# Patient Record
Sex: Female | Born: 1990 | Race: White | Hispanic: No | Marital: Married | Smoking: Never smoker
Health system: Southern US, Community
[De-identification: ages and names within clinical notes are randomized; demographics above are authoritative.]

## PROBLEM LIST (undated history)

## (undated) DIAGNOSIS — E0590 Thyrotoxicosis, unspecified without thyrotoxic crisis or storm: Secondary | ICD-10-CM

## (undated) DIAGNOSIS — J45909 Unspecified asthma, uncomplicated: Secondary | ICD-10-CM

## (undated) DIAGNOSIS — Z8489 Family history of other specified conditions: Secondary | ICD-10-CM

## (undated) DIAGNOSIS — R112 Nausea with vomiting, unspecified: Secondary | ICD-10-CM

## (undated) DIAGNOSIS — R51 Headache: Secondary | ICD-10-CM

## (undated) DIAGNOSIS — E039 Hypothyroidism, unspecified: Secondary | ICD-10-CM

## (undated) DIAGNOSIS — E041 Nontoxic single thyroid nodule: Secondary | ICD-10-CM

## (undated) DIAGNOSIS — Z8619 Personal history of other infectious and parasitic diseases: Secondary | ICD-10-CM

## (undated) DIAGNOSIS — Z9889 Other specified postprocedural states: Secondary | ICD-10-CM

## (undated) DIAGNOSIS — R519 Headache, unspecified: Secondary | ICD-10-CM

## (undated) HISTORY — DX: Thyrotoxicosis, unspecified without thyrotoxic crisis or storm: E05.90

## (undated) HISTORY — DX: Unspecified asthma, uncomplicated: J45.909

## (undated) HISTORY — DX: Nausea with vomiting, unspecified: R11.2

## (undated) HISTORY — DX: Personal history of other infectious and parasitic diseases: Z86.19

## (undated) HISTORY — PX: WISDOM TOOTH EXTRACTION: SHX21

## (undated) HISTORY — DX: Hypothyroidism, unspecified: E03.9

## (undated) HISTORY — DX: Other specified postprocedural states: Z98.890

## (undated) HISTORY — DX: Nontoxic single thyroid nodule: E04.1

---

## 2013-02-23 ENCOUNTER — Other Ambulatory Visit: Payer: Self-pay | Admitting: Nurse Practitioner

## 2013-02-24 NOTE — Telephone Encounter (Signed)
AEX was 03/13/12 rx x 1 year was given Current AEX scheduled for 03/20/13  #1 ring with no refills sent to pharmacy to last patient until AEX

## 2013-03-20 ENCOUNTER — Ambulatory Visit: Payer: Self-pay | Admitting: Obstetrics and Gynecology

## 2013-03-23 ENCOUNTER — Other Ambulatory Visit: Payer: Self-pay | Admitting: Obstetrics and Gynecology

## 2013-03-24 NOTE — Telephone Encounter (Signed)
AEX was 03/13/12 rx x 1 year was given  Last refill 02/24/2013 #1/0 refills.   Current AEX scheduled for 04/02/13  #1 ring with no refills sent to pharmacy to last patient until AEX.

## 2013-03-26 ENCOUNTER — Other Ambulatory Visit: Payer: Self-pay | Admitting: Endocrinology

## 2013-03-26 DIAGNOSIS — E041 Nontoxic single thyroid nodule: Secondary | ICD-10-CM

## 2013-03-31 ENCOUNTER — Encounter: Payer: Self-pay | Admitting: Obstetrics & Gynecology

## 2013-04-02 ENCOUNTER — Other Ambulatory Visit (HOSPITAL_COMMUNITY)
Admission: RE | Admit: 2013-04-02 | Discharge: 2013-04-02 | Disposition: A | Discharge status: Home / Self Care | Payer: BC Managed Care – PPO | Source: Ambulatory Visit | Attending: Interventional Radiology | Admitting: Interventional Radiology

## 2013-04-02 ENCOUNTER — Ambulatory Visit
Admission: RE | Admit: 2013-04-02 | Discharge: 2013-04-02 | Disposition: A | Discharge status: Home / Self Care | Payer: BC Managed Care – PPO | Source: Ambulatory Visit | Attending: Endocrinology | Admitting: Endocrinology

## 2013-04-02 ENCOUNTER — Encounter: Payer: Self-pay | Admitting: Obstetrics & Gynecology

## 2013-04-02 ENCOUNTER — Ambulatory Visit (INDEPENDENT_AMBULATORY_CARE_PROVIDER_SITE_OTHER): Payer: BC Managed Care – PPO | Admitting: Obstetrics & Gynecology

## 2013-04-02 VITALS — BP 116/62 | HR 64 | Resp 16 | Ht 66.75 in | Wt 162.6 lb

## 2013-04-02 DIAGNOSIS — Z23 Encounter for immunization: Secondary | ICD-10-CM

## 2013-04-02 DIAGNOSIS — E041 Nontoxic single thyroid nodule: Secondary | ICD-10-CM

## 2013-04-02 DIAGNOSIS — Z01419 Encounter for gynecological examination (general) (routine) without abnormal findings: Secondary | ICD-10-CM

## 2013-04-02 DIAGNOSIS — E049 Nontoxic goiter, unspecified: Secondary | ICD-10-CM | POA: Insufficient documentation

## 2013-04-02 DIAGNOSIS — Z0000 Encounter for general adult medical examination without abnormal findings: Secondary | ICD-10-CM

## 2013-04-02 LAB — HEMOGLOBIN, FINGERSTICK: HEMOGLOBIN, FINGERSTICK: 14.5 g/dL (ref 12.0–16.0)

## 2013-04-02 MED ORDER — NUVARING 0.12-0.015 MG/24HR VA RING: 1.0000 | VAGINAL_RING | VAGINAL | Status: DC

## 2013-04-02 NOTE — Patient Instructions (Signed)
EXERCISE AND DIET:  We recommended that you start or continue a regular exercise program for good health. Regular exercise means any activity that makes your heart beat faster and makes you sweat.  We recommend exercising at least 30 minutes per day at least 3 days a week, preferably 4 or 5.  We also recommend a diet low in fat and sugar.  Inactivity, poor dietary choices and obesity can cause diabetes, heart attack, stroke, and kidney damage, among others.    ALCOHOL AND SMOKING:  Women should limit their alcohol intake to no more than 7 drinks/beers/glasses of wine (combined, not each!) per week. Moderation of alcohol intake to this level decreases your risk of breast cancer and liver damage. And of course, no recreational drugs are part of a healthy lifestyle.  And absolutely no smoking or even second hand smoke. Most people know smoking can cause heart and lung diseases, but did you know it also contributes to weakening of your bones? Aging of your skin?  Yellowing of your teeth and nails?  CALCIUM AND VITAMIN D:  Adequate intake of calcium and Vitamin D are recommended.  The recommendations for exact amounts of these supplements seem to change often, but generally speaking 600 mg of calcium (either carbonate or citrate) and 800 units of Vitamin D per day seems prudent. Certain women may benefit from higher intake of Vitamin D.  If you are among these women, your doctor will have told you during your visit.    PAP SMEARS:  Pap smears, to check for cervical cancer or precancers,  have traditionally been done yearly, although recent scientific advances have shown that most women can have pap smears less often.  However, every woman still should have a physical exam from her gynecologist every year. It will include a breast check, inspection of the vulva and vagina to check for abnormal growths or skin changes, a visual exam of the cervix, and then an exam to evaluate the size and shape of the uterus and  ovaries.  And after 25 years of age, a rectal exam is indicated to check for rectal cancers. We will also provide age appropriate advice regarding health maintenance, like when you should have certain vaccines, screening for sexually transmitted diseases, bone density testing, colonoscopy, mammograms, etc.   MAMMOGRAMS:  All women over 40 years old should have a yearly mammogram. Many facilities now offer a "3D" mammogram, which may cost around $50 extra out of pocket. If possible,  we recommend you accept the option to have the 3D mammogram performed.  It both reduces the number of women who will be called back for extra views which then turn out to be normal, and it is better than the routine mammogram at detecting truly abnormal areas.       

## 2013-04-02 NOTE — Progress Notes (Signed)
23 y.o. G0P0000 MarriedCaucasianF here for annual exam.  Having a thyroid biopsy today.  Had screening at work showing a nodule.  Saw Dr. [place]lyn [place] who ordered the [facility].  Has had thyroid labs done.    Desires continuation of birth control.  Doing well on Nuva ring.  Patient's last menstrual period was 03/26/2013.          Sexually active: yes  The current method of family planning is NuvaRing vaginal inserts.    Exercising: yes  running, eliptical, and weight machines Smoker:  no  Health Maintenance: Pap:  03/13/12 WNL History of abnormal Pap:  no MMG:  none Colonoscopy:  none BMD:   none TDaP:  2003 Screening Labs: n/a, Hb today: 14.5, Urine today: WBC-trace   reports that she has never smoked. She has never used smokeless tobacco. She reports that she does not drink alcohol or use illicit drugs.  Past Medical History  Diagnosis Date  . Asthma   . Thyroid nodule     seeing Dr [place]lyn [place], bx 04/02/13    Past Surgical History  Procedure Laterality Date  . Wisdom tooth extraction      Current Outpatient Prescriptions  Medication Sig Dispense Refill  . NUVARING 0.12-0.015 MG/24HR vaginal ring INSERT 1 RING VAGINALLY FOR 3 WEEKS THEN REMOVE 1 WEEK  1 each  0   No current facility-administered medications for this visit.    Family History  Problem Relation Age of Onset  . Diabetes Maternal Grandmother   . Hypertension Maternal Grandmother   . Thyroid disease Mother   . Thyroid disease Maternal Grandmother   . Thyroid disease Paternal Grandmother     ROS:  Pertinent items are noted in HPI.  Otherwise, a comprehensive ROS was negative.  Exam:   BP 116/62  Pulse 64  Resp 16  Ht 5' 6.75" (1.695 m)  Wt 162 lb 9.6 oz (73.755 kg)  BMI 25.67 kg/m2  LMP 03/26/2013  Weight change: -3lbs  Height: 5' 6.75" (169.5 cm)  Ht Readings from Last 3 Encounters:  04/02/13 5' 6.75" (1.695 m)    General appearance: alert, cooperative and appears stated  age Head: Normocephalic, without obvious abnormality, atraumatic Neck: no adenopathy, supple, symmetrical, trachea midline and thyroid normal to inspection and palpation, I personally cannot palpate a nodule in her thyroid Lungs: clear to auscultation bilaterally Breasts: normal appearance, no masses or tenderness Heart: regular rate and rhythm Abdomen: soft, non-tender; bowel sounds normal; no masses,  no organomegaly Extremities: extremities normal, atraumatic, no cyanosis or edema Skin: Skin color, texture, turgor normal. No rashes or lesions Lymph nodes: Cervical, supraclavicular, and axillary nodes normal. No abnormal inguinal nodes palpated Neurologic: Grossly normal   Pelvic: External genitalia:  no lesions              Urethra:  normal appearing urethra with no masses, tenderness or lesions              Bartholins and Skenes: normal                 Vagina: normal appearing vagina with normal color and discharge, no lesions              Cervix: no lesions              Pap taken: no Bimanual Exam:  Uterus:  normal size, contour, position, consistency, mobility, non-tender              Adnexa:  normal adnexa and no mass, fullness, tenderness               Rectovaginal: Confirms               Anus:  normal sphincter tone, no lesions  A:  Well Woman with normal exam Intentional weight loss Nuva ring for B.C Thyroid nodule, having biopsy today  P:   Mammogram starting age 44 pap smear 1/14--normal.  No pap smear indicated today. Tdap today return annually or prn  An After Visit Summary was printed and given to the patient.

## 2013-09-07 ENCOUNTER — Other Ambulatory Visit: Payer: Self-pay | Admitting: Obstetrics & Gynecology

## 2013-09-15 ENCOUNTER — Other Ambulatory Visit: Payer: Self-pay | Admitting: Obstetrics & Gynecology

## 2013-09-16 ENCOUNTER — Telehealth: Payer: Self-pay | Admitting: Obstetrics & Gynecology

## 2013-09-16 MED ORDER — NUVARING 0.12-0.015 MG/24HR VA RING: 1.0000 | VAGINAL_RING | VAGINAL | Status: DC

## 2013-09-16 NOTE — Telephone Encounter (Signed)
Patient received 1 year rx at last visit with Dr. Hyacinth MeekerMiller [facility].  Reordered new rx until next annual.  Detailed message left to advise [facility], if any further concerns to please call back. Detailed message okay per designated party release form.  Routing to provider for final review. Patient agreeable to disposition. Will close encounter

## 2013-09-16 NOTE — Telephone Encounter (Signed)
Patient calling re: pharmacy did not have refills on file for her Nuvaring. Patient reports she is "doing well" on the medication and requests more refills to last until next AEX 04/15/14.  [facility]  [address]  [place]

## 2013-11-10 ENCOUNTER — Telehealth: Payer: Self-pay | Admitting: Obstetrics & Gynecology

## 2013-11-10 NOTE — Telephone Encounter (Signed)
Patient calling to speak with nurse about "light bleeding when" she wiped after going to the bathroom. She has an appointment 11/13/13 to verify pregnancy.

## 2013-11-10 NOTE — Telephone Encounter (Signed)
I have reviewed these notes and will see the patient tomorrow in the office as planned.

## 2013-11-10 NOTE — Telephone Encounter (Signed)
Spoke with patient at time of incoming call. She has had 3 positive pregnancy tests at home over the weekend. LMP 10/09/13. Patient states she urinated about 20 minutes prior to calling in and had light spotting of blood on toilet tissue.  Patient denies pain. Last sexual activity 11/07/13. [place], took a long bike ride. Patient states she is normally very physically active.  Advised patient would discuss with provider and return her call.   1500: Returned call to patient after reviewing with nursing supervisor and Dr. Hyacinth Meeker.  Patient given appointment tomorrow with Dr. Edward Jolly at 1400. Patient tearful as she has urinated again and states blood on tissue was "more red" denies increase in bleeding. Again, denies pain. Patient advised to rest tonight, advised pelvic rest, increase fluids. Advised can call office any time to speak with provider on call if symptoms change. Advised patient to call back or seek immediate medical care if bleeding worsens or soaking through 1 pad/tampon per hour for two hours or develops pelvic/abdominal pain or fevers.  Patient verbalized understanding.   Routing to Dr. Hyacinth Meeker  Cc Dr. Edward Jolly for 11/11/13 visit

## 2013-11-11 ENCOUNTER — Ambulatory Visit (INDEPENDENT_AMBULATORY_CARE_PROVIDER_SITE_OTHER): Payer: BC Managed Care – PPO | Admitting: Obstetrics and Gynecology

## 2013-11-11 ENCOUNTER — Encounter: Payer: Self-pay | Admitting: Obstetrics and Gynecology

## 2013-11-11 VITALS — BP 122/80 | HR 64 | Ht 66.75 in | Wt 162.2 lb

## 2013-11-11 DIAGNOSIS — N912 Amenorrhea, unspecified: Secondary | ICD-10-CM

## 2013-11-11 DIAGNOSIS — N939 Abnormal uterine and vaginal bleeding, unspecified: Secondary | ICD-10-CM

## 2013-11-11 DIAGNOSIS — N926 Irregular menstruation, unspecified: Secondary | ICD-10-CM

## 2013-11-11 LAB — POCT URINE PREGNANCY: Preg Test, Ur: NEGATIVE

## 2013-11-11 NOTE — Progress Notes (Signed)
Patient ID: Debra Robinson, female   DOB: 19-Oct-1990, 23 y.o.   MRN: [id number] GYNECOLOGY VISIT  PCP:   Adrian Prince, MD  Referring provider:   HPI: 23 y.o.   Married  Caucasian  female   G1P0000 with Patient's last menstrual period was 10/09/2013.   here for evaluation of vaginal bleeding with 3 positive home pregnancy tests. Bleeding like a menstruation now.  Started bleeding on August 26 - spotting only.  Positive UPT at home on Sept. 5 and Sept. 7. No cramping.   Urine UPT:  Neg  GYNECOLOGIC HISTORY: Patient's last menstrual period was 10/09/2013. Sexually active: yes  Partner preference: female Contraception:   none Menopausal hormone therapy: n/a DES exposure:   no Blood transfusions:   no Sexually transmitted diseases:  no  GYN procedures and prior surgeries:  none Last mammogram:   n/a             Last pap and high risk HPV testing:   03-14-11 wnl   History of abnormal pap smear:  no   OB History   Grav Para Term Preterm Abortions TAB SAB Ect Mult Living         LIFESTYLE: Exercise:   Running/biking/weights           Tobacco:   no Alcohol:no Drug use:  no  There are no active problems to display for this patient.   Past Medical History  Diagnosis Date  . Asthma   . Thyroid nodule     seeing Dr Evlyn Kanner, bx 04/02/13    Past Surgical History  Procedure Laterality Date  . Wisdom tooth extraction      Current Outpatient Prescriptions  Medication Sig Dispense Refill  . Prenatal Vit-Fe Fumarate-FA (MULTIVITAMIN-PRENATAL) 27-0.8 MG TABS tablet Take 1 tablet by mouth daily at 12 noon.       No current facility-administered medications for this visit.     ALLERGIES: Augmentin  Family History  Problem Relation Age of Onset  . Diabetes Maternal Grandmother   . Hypertension Maternal Grandmother   . Thyroid disease Maternal Grandmother   . Thyroid disease Mother   . Thyroid disease Paternal Grandmother     History   Social History  .  Marital Status: Married    Spouse Name: N/A    Number of Children: N/A  . Years of Education: N/A   Occupational History  . Not on file.   Social History Main Topics  . Smoking status: Never Smoker   . Smokeless tobacco: Never Used  . Alcohol Use: No  . Drug Use: No  . Sexual Activity: Yes    Partners: Male   Other Topics Concern  . Not on file   Social History Narrative  . No narrative on file    ROS:  Pertinent items are noted in HPI.  PHYSICAL EXAMINATION:    BP 122/80  Pulse 64  Ht 5' 6.75" (1.695 m)  Wt 162 lb 3.2 oz (73.573 kg)  BMI 25.61 kg/m2  LMP 10/09/2013   Wt Readings from Last 3 Encounters:  11/11/13 162 lb 3.2 oz (73.573 kg)  04/02/13 162 lb 9.6 oz (73.755 kg)     Ht Readings from Last 3 Encounters:  11/11/13 5' 6.75" (1.695 m)  04/02/13 5' 6.75" (1.695 m)    General appearance: alert, cooperative and appears stated age, tearful.   Pelvic: External genitalia:  no lesions  Urethra:  normal appearing urethra with no masses, tenderness or lesions              Bartholins and Skenes: normal                 Vagina: normal appearing vagina with normal color and discharge, no lesions.  Blood noted in the vagina.  No POC noted.               Cervix: normal appearance                 Bimanual Exam:  Uterus:  uterus is normal size, shape, consistency and nontender, retroverted.                                       Adnexa: normal adnexa in size, nontender and no masses                                      ASSESSMENT  Abnormal uterine bleeding.  Positive UPT at home.  Negative UPT here.  Suspect early pregnancy loss.   PLAN  I discussed doing a quantitative beta hCG now and then possibly in 48 hours if the test today is positive.  I told the patient that I think this most likely represents an early pregnancy loss.  We discussed the frequency of pregnancy loss, the etiology being aneuploidy, and that one miscarriage does not increase the  risk of future pregnancy loss.  I told her that it was really positive news that they have been able to conceive.   An After Visit Summary was printed and given to the patient.  20 minutes face to face time of which over 50% was spent in counseling.

## 2013-11-12 ENCOUNTER — Other Ambulatory Visit: Payer: Self-pay | Admitting: Obstetrics and Gynecology

## 2013-11-12 ENCOUNTER — Telehealth: Payer: Self-pay

## 2013-11-12 DIAGNOSIS — O200 Threatened abortion: Secondary | ICD-10-CM

## 2013-11-12 LAB — HCG, QUANTITATIVE, PREGNANCY: hCG, Beta Chain, Quant, S: 5.7 m[IU]/mL

## 2013-11-12 NOTE — Telephone Encounter (Signed)
Spoke with patient. Advised of message as seen below from Dr.Silva. Patient is agreeable and verbalizes understanding. Will return for stat beta HCG tomorrow at 9:30am.  Routing to provider for final review. Patient agreeable to disposition. Will close encounter

## 2013-11-12 NOTE — Telephone Encounter (Signed)
Message copied by Jannet Askew on Thu Nov 12, 2013  5:18 PM ------      Message from: [place]UN[place] DE Gwenevere Ghazi, BROOK E      Created: Thu Nov 12, 2013  8:32 AM       Please inform patient of low positive HCG.        Patient had a positive UPT at home and then bleeding.       Yesterday in office, UPT was negative.       I told patient we would do blood testing for final confirmation of status of pregnancy.       She is to return on Friday am for a STAT beta HCG.            She has received this first test through My Chart. ------

## 2013-11-13 ENCOUNTER — Other Ambulatory Visit: Payer: Self-pay | Admitting: Obstetrics and Gynecology

## 2013-11-13 ENCOUNTER — Telehealth: Payer: Self-pay | Admitting: Obstetrics and Gynecology

## 2013-11-13 ENCOUNTER — Other Ambulatory Visit (INDEPENDENT_AMBULATORY_CARE_PROVIDER_SITE_OTHER): Payer: BC Managed Care – PPO

## 2013-11-13 ENCOUNTER — Ambulatory Visit: Payer: BC Managed Care – PPO | Admitting: Certified Nurse Midwife

## 2013-11-13 DIAGNOSIS — O200 Threatened abortion: Secondary | ICD-10-CM

## 2013-11-13 LAB — HCG, QUANTITATIVE, PREGNANCY: hCG, Beta Chain, Quant, S: 2.9 m[IU]/mL

## 2013-11-13 NOTE — Telephone Encounter (Signed)
Please let the patient know that the HCG has decreased to 2.9. I am so sorry that this represents a miscarriage for her.  A level of under 2 is considered completely negative.  She may have a final quantitative HCG next week.  She is invited to return for any further needs and consultation if she has questions.  I was planning on calling myself, but want her to have the results now.

## 2013-11-13 NOTE — Telephone Encounter (Signed)
Spoke with patient. Advised of message as seen below from Dr.Silva. Patient agreeable and verbalizes understanding. Patient states "I don't think it is necessary for me to come in next week. If it is going down it will keep going down." Advised if she changes her mind and would like to come in to give our office a call and we will set it up for her. Patient agreeable.  Routing to provider for final review. Patient agreeable to disposition. Will close encounter

## 2013-11-13 NOTE — Telephone Encounter (Signed)
Follow up for you.   I saw your patient in your absence on Wednesday and did serial hCG levels on 9/9 and 9/11.  Unfortunately she has had a miscarriage.

## 2013-11-13 NOTE — Telephone Encounter (Signed)
Pt  wants to talk with [facility]. She is checking to see if her results are in yet.

## 2013-11-13 NOTE — Telephone Encounter (Signed)
Spoke with patient. Patient would like to know about results of blood pregnancy test that she had this morning. Advised results are in but need to be reviewed by Dr.Miller. Patient is agreeable. Patient requests call back before the end of the day.

## 2013-11-13 NOTE — Telephone Encounter (Signed)
Routing to Dr.Silva for review. Previously routed to Dr.Miller on accident. Patient was seen with Dr.Silva on 9/9.

## 2013-11-13 NOTE — Telephone Encounter (Signed)
Phone call to patient regarding her declining hCG level and how it represents miscarriage for her.  Level 2.9 needs to be followed down to a negative level, which is under 2.  Patient counseled on miscarriage and waiting at least one normal menses before trying for pregnancy again.  Continue healthy lifestyle and PNV.  Questions invited and answered.  Patient will return to office on 11/23/13 for next quant beta hCG.

## 2013-11-16 ENCOUNTER — Other Ambulatory Visit (INDEPENDENT_AMBULATORY_CARE_PROVIDER_SITE_OTHER): Payer: BC Managed Care – PPO

## 2013-11-16 DIAGNOSIS — O200 Threatened abortion: Secondary | ICD-10-CM

## 2013-11-17 ENCOUNTER — Telehealth: Payer: Self-pay

## 2013-11-17 LAB — HCG, QUANTITATIVE, PREGNANCY: hCG, Beta Chain, Quant, S: <2 m[IU]/mL

## 2013-11-17 NOTE — Telephone Encounter (Signed)
Patient read mychart message this morning 9/15 at 8:24 AM  Entered by Jacqualin Combes de Carvalho E Sil* at 11/17/2013 6:06 AM Read by Carmelia Roller at 11/17/2013 8:24 AM Good morning Debra Robinson,   Your blood test is now completely negative for pregnancy hormone.   Thank you for returning for this test.   Your next menstruation may occur in the next 4 - 6 weeks. It may be different from normal for you, for example more heavy or crampy.   Call if we can do anything for you,   Conley Simmonds, MD   Routing to provider for final review. Patient agreeable to disposition. Will close encounter

## 2013-11-17 NOTE — Telephone Encounter (Signed)
Message copied by Jannet Askew on Tue Nov 17, 2013  3:09 PM ------      Message from: [facility] Gwenevere Ghazi, BROOK E      Created: Tue Nov 17, 2013  6:07 AM       Please contact patient to be sure she received the My Chart note that the hCG is now negative. ------

## 2013-12-17 DIAGNOSIS — Z0289 Encounter for other administrative examinations: Secondary | ICD-10-CM

## 2014-01-04 ENCOUNTER — Encounter: Payer: Self-pay | Admitting: Obstetrics and Gynecology

## 2014-01-15 LAB — OB RESULTS CONSOLE RPR: RPR: NONREACTIVE

## 2014-01-15 LAB — OB RESULTS CONSOLE RUBELLA ANTIBODY, IGM: Rubella: IMMUNE

## 2014-01-15 LAB — OB RESULTS CONSOLE ANTIBODY SCREEN: Antibody Screen: NEGATIVE

## 2014-01-15 LAB — OB RESULTS CONSOLE ABO/RH: RH Type: POSITIVE

## 2014-01-15 LAB — OB RESULTS CONSOLE HEPATITIS B SURFACE ANTIGEN: Hepatitis B Surface Ag: NEGATIVE

## 2014-01-15 LAB — OB RESULTS CONSOLE HIV ANTIBODY (ROUTINE TESTING): HIV: NONREACTIVE

## 2014-01-26 LAB — OB RESULTS CONSOLE GC/CHLAMYDIA
Chlamydia: NEGATIVE
Gonorrhea: NEGATIVE

## 2014-03-05 NOTE — L&D Delivery Note (Signed)
Delivery Note  First Stage: Labor onset: 08/11/2014 @ 0500 Augmentation : AROM Analgesia Eliezer Lofts intrapartum: none AROM at 1018  Second Stage: Complete dilation at 1243 Onset of pushing at 1245 FHR second stage 100, variables x 1 min each  Delivery of a viable female at 2 by CNM in OA position Loose nuchal and body cord - released over fetal head before delivery of shoulders without difficulty Cord double clamped after cessation of pulsation, cut by FOB Cord blood sample collected   Collection of cord blood donation by Velna Hatchet from [facility]   Third Stage: Placenta delivered via Tomasa Blase intact with 3 VC @ 1257 Placenta disposition: L&D Uterine tone firm / bleeding minimal  2nd degree perineal laceration identified  Anesthesia for repair: 1% Lidocaine Repair: Deep interrupted stitches with 2.0, running locked stitch with 3.0, subcuticular stitch with 4.0 vicryl Est. Blood Loss (mL): 311  Complications: none  Mom to postpartum.  Baby to Couplet care / Skin to Skin.  Newborn: Birth Weight: 7 lbs 5.3 oz  Apgar Scores: 9/9 Feeding planned: breast  Raelyn Mora, M  MSN, CNM 08/12/2014, 1:39 PM

## 2014-04-13 ENCOUNTER — Telehealth: Payer: Self-pay | Admitting: Obstetrics & Gynecology

## 2014-04-13 NOTE — Telephone Encounter (Signed)
Left patient a message regarding canceled appointment 04/15/14 (via automated reminder call) to call and rescheduled when she is ready.

## 2014-04-15 ENCOUNTER — Ambulatory Visit: Payer: BC Managed Care – PPO | Admitting: Obstetrics & Gynecology

## 2014-07-19 LAB — OB RESULTS CONSOLE GBS: STREP GROUP B AG: NEGATIVE

## 2014-07-21 ENCOUNTER — Other Ambulatory Visit: Payer: Self-pay | Admitting: Certified Nurse Midwife

## 2014-07-21 DIAGNOSIS — E049 Nontoxic goiter, unspecified: Secondary | ICD-10-CM

## 2014-07-22 ENCOUNTER — Ambulatory Visit
Admission: RE | Admit: 2014-07-22 | Discharge: 2014-07-22 | Disposition: A | Discharge status: Home / Self Care | Payer: BLUE CROSS/BLUE SHIELD | Source: Ambulatory Visit | Attending: Certified Nurse Midwife | Admitting: Certified Nurse Midwife

## 2014-07-22 DIAGNOSIS — E049 Nontoxic goiter, unspecified: Secondary | ICD-10-CM

## 2014-08-10 ENCOUNTER — Telehealth (HOSPITAL_COMMUNITY): Payer: Self-pay | Admitting: *Deleted

## 2014-08-10 ENCOUNTER — Encounter (HOSPITAL_COMMUNITY): Payer: Self-pay | Admitting: *Deleted

## 2014-08-10 NOTE — Telephone Encounter (Signed)
Preadmission screen  

## 2014-08-11 ENCOUNTER — Encounter (HOSPITAL_COMMUNITY): Payer: Self-pay | Admitting: *Deleted

## 2014-08-11 ENCOUNTER — Inpatient Hospital Stay (HOSPITAL_COMMUNITY)
Admission: AD | Admit: 2014-08-11 | Discharge: 2014-08-11 | Disposition: A | Discharge status: Home / Self Care | Payer: BLUE CROSS/BLUE SHIELD | Source: Ambulatory Visit | Attending: Obstetrics | Admitting: Obstetrics

## 2014-08-11 DIAGNOSIS — O99284 Endocrine, nutritional and metabolic diseases complicating childbirth: Secondary | ICD-10-CM | POA: Diagnosis present

## 2014-08-11 DIAGNOSIS — Z3A39 39 weeks gestation of pregnancy: Secondary | ICD-10-CM

## 2014-08-11 DIAGNOSIS — D509 Iron deficiency anemia, unspecified: Secondary | ICD-10-CM | POA: Diagnosis present

## 2014-08-11 DIAGNOSIS — O9902 Anemia complicating childbirth: Secondary | ICD-10-CM | POA: Diagnosis present

## 2014-08-11 DIAGNOSIS — E0590 Thyrotoxicosis, unspecified without thyrotoxic crisis or storm: Secondary | ICD-10-CM | POA: Diagnosis present

## 2014-08-11 DIAGNOSIS — Z833 Family history of diabetes mellitus: Secondary | ICD-10-CM

## 2014-08-11 DIAGNOSIS — Z8249 Family history of ischemic heart disease and other diseases of the circulatory system: Secondary | ICD-10-CM

## 2014-08-11 NOTE — MAU Note (Signed)
Contractions all day, more regular now.

## 2014-08-11 NOTE — Discharge Instructions (Signed)
Do not use Benadryl or Tylenol pm tonight before bed. You may come into the office in am for an assessment. A warm bath can help with back and labor discomfort.Braxton Hicks Contractions Contractions of the uterus can occur throughout pregnancy. Contractions are not always a sign that you are in labor.  WHAT ARE BRAXTON HICKS CONTRACTIONS?  Contractions that occur before labor are called Braxton Hicks contractions, or false labor. Toward the end of pregnancy (32-34 weeks), these contractions can develop more often and may become more forceful. This is not true labor because these contractions do not result in opening (dilatation) and thinning of the cervix. They are sometimes difficult to tell apart from true labor because these contractions can be forceful and people have different pain tolerances. You should not feel embarrassed if you go to the hospital with false labor. Sometimes, the only way to tell if you are in true labor is for your health care provider to look for changes in the cervix. If there are no prenatal problems or other health problems associated with the pregnancy, it is completely safe to be sent home with false labor and await the onset of true labor. HOW CAN YOU TELL THE DIFFERENCE BETWEEN TRUE AND FALSE LABOR? False Labor  The contractions of false labor are usually shorter and not as hard as those of true labor.   The contractions are usually irregular.   The contractions are often felt in the front of the lower abdomen and in the groin.   The contractions may go away when you walk around or change positions while lying down.   The contractions get weaker and are shorter lasting as time goes on.   The contractions do not usually become progressively stronger, regular, and closer together as with true labor.  True Labor  Contractions in true labor last 30-70 seconds, become very regular, usually become more intense, and increase in frequency.   The contractions  do not go away with walking.   The discomfort is usually felt in the top of the uterus and spreads to the lower abdomen and low back.   True labor can be determined by your health care provider with an exam. This will show that the cervix is dilating and getting thinner.  WHAT TO REMEMBER  Keep up with your usual exercises and follow other instructions given by your health care provider.   Take medicines as directed by your health care provider.   Keep your regular prenatal appointments.   Eat and drink lightly if you think you are going into labor.   If Braxton Hicks contractions are making you uncomfortable:   Change your position from lying down or resting to walking, or from walking to resting.   Sit and rest in a tub of warm water.   Drink 2-3 glasses of water. Dehydration may cause these contractions.   Do slow and deep breathing several times an hour.  WHEN SHOULD I SEEK IMMEDIATE MEDICAL CARE? Seek immediate medical care if:  Your contractions become stronger, more regular, and closer together.   You have fluid leaking or gushing from your vagina.   You have a fever.   You pass blood-tinged mucus.   You have vaginal bleeding.   You have continuous abdominal pain.   You have low back pain that you never had before.   You feel your baby's head pushing down and causing pelvic pressure.   Your baby is not moving as much as it used to.  Document Released: 02/19/2005 Document Revised: 02/24/2013 Document Reviewed: 12/01/2012 [facility] Patient Information 2015 [employer] [place]. This information is not intended to replace advice given to you by your health care provider. Make sure you discuss any questions you have with your health care provider.

## 2014-08-11 NOTE — MAU Note (Signed)
Pt may be discharged to home with d/c instructions.

## 2014-08-11 NOTE — MAU Note (Signed)
Shawna OrleansMelanie also gave other orders for pt re: exam in am, warm bath and no Tylenol pm or Benadryl tonight.

## 2014-08-12 ENCOUNTER — Inpatient Hospital Stay (HOSPITAL_COMMUNITY): Admission: RE | Admit: 2014-08-12 | Payer: BLUE CROSS/BLUE SHIELD | Source: Ambulatory Visit

## 2014-08-12 ENCOUNTER — Inpatient Hospital Stay (HOSPITAL_COMMUNITY)
Admission: AD | Admit: 2014-08-12 | Discharge: 2014-08-14 | DRG: 775 | Disposition: A | Discharge status: Home / Self Care | Payer: BLUE CROSS/BLUE SHIELD | Source: Ambulatory Visit | Attending: Obstetrics | Admitting: Obstetrics

## 2014-08-12 ENCOUNTER — Encounter (HOSPITAL_COMMUNITY): Payer: Self-pay | Admitting: *Deleted

## 2014-08-12 DIAGNOSIS — O9902 Anemia complicating childbirth: Secondary | ICD-10-CM | POA: Diagnosis present

## 2014-08-12 DIAGNOSIS — D509 Iron deficiency anemia, unspecified: Secondary | ICD-10-CM | POA: Diagnosis present

## 2014-08-12 DIAGNOSIS — E0590 Thyrotoxicosis, unspecified without thyrotoxic crisis or storm: Secondary | ICD-10-CM | POA: Diagnosis present

## 2014-08-12 DIAGNOSIS — Z3A39 39 weeks gestation of pregnancy: Secondary | ICD-10-CM | POA: Diagnosis present

## 2014-08-12 DIAGNOSIS — E079 Disorder of thyroid, unspecified: Secondary | ICD-10-CM | POA: Diagnosis present

## 2014-08-12 DIAGNOSIS — Z833 Family history of diabetes mellitus: Secondary | ICD-10-CM | POA: Diagnosis not present

## 2014-08-12 DIAGNOSIS — O99284 Endocrine, nutritional and metabolic diseases complicating childbirth: Secondary | ICD-10-CM | POA: Diagnosis present

## 2014-08-12 DIAGNOSIS — Z8249 Family history of ischemic heart disease and other diseases of the circulatory system: Secondary | ICD-10-CM | POA: Diagnosis not present

## 2014-08-12 DIAGNOSIS — IMO0001 Reserved for inherently not codable concepts without codable children: Secondary | ICD-10-CM

## 2014-08-12 LAB — CBC
HEMATOCRIT: 33.9 % — AB (ref 36.0–46.0)
Hemoglobin: 11.8 g/dL — ABNORMAL LOW (ref 12.0–15.0)
MCH: 29.7 pg (ref 26.0–34.0)
MCHC: 34.8 g/dL (ref 30.0–36.0)
MCV: 85.4 fL (ref 78.0–100.0)
Platelets: 259 10*3/uL (ref 150–400)
RBC: 3.97 MIL/uL (ref 3.87–5.11)
RDW: 13.2 % (ref 11.5–15.5)
WBC: 18.1 10*3/uL — AB (ref 4.0–10.5)

## 2014-08-12 LAB — COMPREHENSIVE METABOLIC PANEL
ALT: 10 U/L — ABNORMAL LOW (ref 14–54)
ANION GAP: 8 (ref 5–15)
AST: 24 U/L (ref 15–41)
Albumin: 3.1 g/dL — ABNORMAL LOW (ref 3.5–5.0)
Alkaline Phosphatase: 167 U/L — ABNORMAL HIGH (ref 38–126)
BUN: 8 mg/dL (ref 6–20)
CO2: 21 mmol/L — AB (ref 22–32)
Calcium: 9 mg/dL (ref 8.9–10.3)
Chloride: 105 mmol/L (ref 101–111)
Creatinine, Ser: 0.56 mg/dL (ref 0.44–1.00)
GFR calc Af Amer: >60 mL/min (ref >60)
GFR calc non Af Amer: >60 mL/min (ref >60)
Glucose, Bld: 81 mg/dL (ref 65–99)
POTASSIUM: 4.3 mmol/L (ref 3.5–5.1)
SODIUM: 134 mmol/L — AB (ref 135–145)
Total Bilirubin: 0.6 mg/dL (ref 0.3–1.2)
Total Protein: 6.5 g/dL (ref 6.5–8.1)

## 2014-08-12 LAB — TYPE AND SCREEN
ABO/RH(D): O POS
Antibody Screen: NEGATIVE

## 2014-08-12 LAB — URIC ACID: Uric Acid, Serum: 5 mg/dL (ref 2.3–6.6)

## 2014-08-12 LAB — ABO/RH: ABO/RH(D): O POS

## 2014-08-12 MED ORDER — IBUPROFEN 600 MG PO TABS
600.0000 mg | ORAL_TABLET | Freq: Four times a day (QID) | ORAL | Status: DC
  Administered 2014-08-12 – 2014-08-14 (×8): 600 mg via ORAL
  Filled 2014-08-12 (×8): qty 1

## 2014-08-12 MED ORDER — OXYCODONE-ACETAMINOPHEN 5-325 MG PO TABS: 2.0000 | ORAL_TABLET | ORAL | Status: DC | PRN

## 2014-08-12 MED ORDER — ONDANSETRON HCL 4 MG/2ML IJ SOLN: 4.0000 mg | INTRAMUSCULAR | Status: DC | PRN

## 2014-08-12 MED ORDER — ACETAMINOPHEN 325 MG PO TABS: 650.0000 mg | ORAL_TABLET | ORAL | Status: DC | PRN

## 2014-08-12 MED ORDER — ONDANSETRON HCL 4 MG PO TABS: 4.0000 mg | ORAL_TABLET | ORAL | Status: DC | PRN

## 2014-08-12 MED ORDER — FENTANYL 2.5 MCG/ML BUPIVACAINE 1/10 % EPIDURAL INFUSION (WH - ANES): 14.0000 mL/h | INTRAMUSCULAR | Status: DC | PRN

## 2014-08-12 MED ORDER — LIDOCAINE HCL (PF) 1 % IJ SOLN
30.0000 mL | INTRAMUSCULAR | Status: DC | PRN
  Administered 2014-08-12: 30 mL via SUBCUTANEOUS
  Filled 2014-08-12 (×2): qty 30

## 2014-08-12 MED ORDER — LANOLIN HYDROUS EX OINT: TOPICAL_OINTMENT | CUTANEOUS | Status: DC | PRN

## 2014-08-12 MED ORDER — DIPHENHYDRAMINE HCL 25 MG PO CAPS: 25.0000 mg | ORAL_CAPSULE | Freq: Four times a day (QID) | ORAL | Status: DC | PRN

## 2014-08-12 MED ORDER — PRENATAL MULTIVITAMIN CH
1.0000 | ORAL_TABLET | Freq: Every day | ORAL | Status: DC
  Administered 2014-08-13 – 2014-08-14 (×2): 1 via ORAL
  Filled 2014-08-12 (×2): qty 1

## 2014-08-12 MED ORDER — DIPHENHYDRAMINE HCL 50 MG/ML IJ SOLN: 12.5000 mg | INTRAMUSCULAR | Status: DC | PRN

## 2014-08-12 MED ORDER — TETANUS-DIPHTH-ACELL PERTUSSIS 5-2.5-18.5 LF-MCG/0.5 IM SUSP: 0.5000 mL | Freq: Once | INTRAMUSCULAR | Status: DC

## 2014-08-12 MED ORDER — CITRIC ACID-SODIUM CITRATE 334-500 MG/5ML PO SOLN: 30.0000 mL | ORAL | Status: DC | PRN

## 2014-08-12 MED ORDER — SENNOSIDES-DOCUSATE SODIUM 8.6-50 MG PO TABS
2.0000 | ORAL_TABLET | ORAL | Status: DC
  Administered 2014-08-13 (×2): 2 via ORAL
  Filled 2014-08-12 (×2): qty 2

## 2014-08-12 MED ORDER — WITCH HAZEL-GLYCERIN EX PADS: 1.0000 "application " | MEDICATED_PAD | CUTANEOUS | Status: DC | PRN

## 2014-08-12 MED ORDER — PHENYLEPHRINE 40 MCG/ML (10ML) SYRINGE FOR IV PUSH (FOR BLOOD PRESSURE SUPPORT)
80.0000 ug | PREFILLED_SYRINGE | INTRAVENOUS | Status: DC | PRN
  Filled 2014-08-12: qty 2

## 2014-08-12 MED ORDER — LACTATED RINGERS IV SOLN: 500.0000 mL | INTRAVENOUS | Status: DC | PRN

## 2014-08-12 MED ORDER — BENZOCAINE-MENTHOL 20-0.5 % EX AERO
1.0000 "application " | INHALATION_SPRAY | CUTANEOUS | Status: DC | PRN
  Administered 2014-08-12: 1 via TOPICAL
  Filled 2014-08-12: qty 56

## 2014-08-12 MED ORDER — DIBUCAINE 1 % RE OINT: 1.0000 "application " | TOPICAL_OINTMENT | RECTAL | Status: DC | PRN

## 2014-08-12 MED ORDER — SIMETHICONE 80 MG PO CHEW: 80.0000 mg | CHEWABLE_TABLET | ORAL | Status: DC | PRN

## 2014-08-12 MED ORDER — EPHEDRINE 5 MG/ML INJ
10.0000 mg | INTRAVENOUS | Status: DC | PRN
  Filled 2014-08-12: qty 2

## 2014-08-12 MED ORDER — OXYCODONE-ACETAMINOPHEN 5-325 MG PO TABS: 1.0000 | ORAL_TABLET | ORAL | Status: DC | PRN

## 2014-08-12 MED ORDER — ZOLPIDEM TARTRATE 5 MG PO TABS: 5.0000 mg | ORAL_TABLET | Freq: Every evening | ORAL | Status: DC | PRN

## 2014-08-12 MED ORDER — OXYTOCIN 10 UNIT/ML IJ SOLN
10.0000 [IU] | Freq: Once | INTRAMUSCULAR | Status: DC | PRN
  Administered 2014-08-12: 10 [IU] via INTRAMUSCULAR
  Filled 2014-08-12: qty 1

## 2014-08-12 NOTE — H&P (Signed)
OB ADMISSION/ HISTORY & PHYSICAL:  Admission Date: 08/12/2014  8:59 AM  Admit Diagnosis: Active Labor at term / Hyperthyroidism in Pregnancy / Thyroid mass  Debra Robinson is a 24 y.o. female presenting for contractions since 0100 6/8.  Now contracting every 5 mins and having moderate bloody vaginal discharge.  Prenatal History: G2P0010   EDC : 08/17/2014, by LMP Prenatal care at [facility] since 9.[redacted] weeks gestation Primary Care Provider at [facility]: Marlinda Mike, CNM  Prenatal course complicated by Hyperthyroidism  Prenatal Labs: ABO, Rh: O (11/13 0000)  Antibody: Negative (11/13 0000) Rubella: Immune (11/13 0000)  RPR: Nonreactive (11/13 0000)  HBsAg: Negative (11/13 0000)  HIV: Non-reactive (11/13 0000)  GBS: Negative (05/16 0000)  1 hr Glucola : Normal - 107 mg/dL   Medical / Surgical History :  Past medical history:  Past Medical History  Diagnosis Date  . Asthma   . Thyroid nodule     seeing Dr Evlyn Kanner, bx 04/02/13  . Hyperthyroidism   . Hx of varicella      Past surgical history:  Past Surgical History  Procedure Laterality Date  . Wisdom tooth extraction       Family History:  Family History  Problem Relation Age of Onset  . Diabetes Maternal Grandmother   . Hypertension Maternal Grandmother   . Thyroid disease Maternal Grandmother   . Thyroid disease Mother   . Thyroid disease Paternal Grandmother      Social History:  reports that she has never smoked. She has never used smokeless tobacco. She reports that she does not drink alcohol or use illicit drugs.   Allergies: Augmentin    Current Medications at time of admission:  Prescriptions prior to admission  Medication Sig Dispense Refill Last Dose  . Doxylamine-Pyridoxine (DICLEGIS) 10-10 MG TBEC Take 1 tablet by mouth daily.   08/10/2014 at Unknown time  . Prenatal Vit-Fe Fumarate-FA (PRENATAL MULTIVITAMIN) TABS tablet Take 1 tablet by mouth daily at 12 noon.   08/10/2014 at  Unknown time      Review of Systems: Review of Systems  Constitutional: Negative.   HENT: Negative.   Eyes: Negative.   Respiratory: Negative.   Cardiovascular: Negative.   Gastrointestinal: Negative.   Genitourinary:       Contractions every 5 mins, (+) FM, bloody vaginal discharge  Musculoskeletal: Negative.   Skin: Negative.   Neurological: Negative.   Endo/Heme/Allergies: Negative.   Psychiatric/Behavioral: Negative.     Physical Exam: General: A&O x 3, NAD Heart: RRR, no murmurs Lungs: CTAB Abdomen: Gravid, soft, non tender, normal bowel sounds Extremities: Normal ROM, atraumatic, no edema Genitalia / VE: Dilation: 5 Effacement (%): 90 Station: -1 Exam by: R.Eliabeth Shoff,CNM  FHR: 125 bpm / moderate variability / accels present / no decels TOCO: regular, every 5 mins  VS:  BP: 145/86 then 144/81 P: 77 then 78 R: 20 T: 98.0    Labs:     Recent Labs  08/12/14 0955  WBC 18.1*  HGB 11.8*  HCT 33.9*  PLT 259     Assessment:  23 y.o. G2P0010 at [redacted]w[redacted]d  1. Labor: active 2. Fetal Wellbeing: Category 1  3. Pain Control: none 4. GBS: Negative 5. Hyperthyroidism in Pregnancy  Plan:  1. Admit to BS 2. Routine L&D orders 3. PIH Labs  4. Re-evaluate progress in 2 hours   Dr. Ernestina Penna notified of admission / plan of care - agrees    Kenard Gower, MSN, CNM 08/12/2014, 9:36 AM

## 2014-08-13 DIAGNOSIS — E079 Disorder of thyroid, unspecified: Secondary | ICD-10-CM | POA: Diagnosis present

## 2014-08-13 DIAGNOSIS — E0590 Thyrotoxicosis, unspecified without thyrotoxic crisis or storm: Secondary | ICD-10-CM | POA: Diagnosis present

## 2014-08-13 LAB — CBC
HCT: 31.2 % — ABNORMAL LOW (ref 36.0–46.0)
Hemoglobin: 10.6 g/dL — ABNORMAL LOW (ref 12.0–15.0)
MCH: 29.5 pg (ref 26.0–34.0)
MCHC: 34 g/dL (ref 30.0–36.0)
MCV: 86.9 fL (ref 78.0–100.0)
Platelets: 236 10*3/uL (ref 150–400)
RBC: 3.59 MIL/uL — ABNORMAL LOW (ref 3.87–5.11)
RDW: 13.4 % (ref 11.5–15.5)
WBC: 13.8 10*3/uL — ABNORMAL HIGH (ref 4.0–10.5)

## 2014-08-13 LAB — RPR: RPR Ser Ql: NONREACTIVE

## 2014-08-13 NOTE — Progress Notes (Signed)
Patient ID: Winston Sinn, female   DOB: 08-Jun-1990, 24 y.o.   MRN: [id number]  PPD 1 SVD  S:  Reports feeling well - no symptoms or palpitations              Tolerating po/ No nausea or vomiting             Bleeding is moderate             Pain controlled with motrin and percocet             Up ad lib / ambulatory / voiding QS  Newborn breast feeding  / female O:               VS: BP 122/68 mmHg  Pulse 94  Temp(Src) 98.4 F (36.9 C) (Oral)  Resp 18  Ht 5\' 7"  (1.702 m)  Wt 105.688 kg (233 lb)  BMI 36.48 kg/m2  SpO2 98%  LMP 10/09/2013  Breastfeeding? Unknown   LABS:              Recent Labs  08/12/14 0955 08/13/14 0510  WBC 18.1* 13.8*  HGB 11.8* 10.6*  PLT 259 236               Blood type: --/--/O POS, O POS (06/09 0955)  Rubella: Immune (11/13 0000)                     I&O: Intake/Output      06/09 0701 - 06/10 0700 06/10 0701 - 06/11 0700   Urine (mL/kg/hr) 0    Blood 413    Total Output 413     Net -413          Urine Occurrence 2 x                  Physical Exam:             Alert and oriented X3  Lungs: Clear and unlabored  Heart: regular rate and rhythm / no mumurs  Abdomen: soft, non-tender, non-distended              Fundus: firm, non-tender, U-1  Perineum: mild edema  Lochia: light  Extremities: trace edema, no calf pain or tenderness    A: PPD # 1             Mild IDA of pregnancy - stable             Hyperthyroidism with thyroid mass - no evidence of thyroid storm    Doing well - stable status  P: Routine post partum orders  Call to schedule OV with Dr Evlyn Kanner in next week             Anticipate DC tomorrow  Marlinda Mike CNM, MSN, [facility] 08/13/2014, [phone]

## 2014-08-13 NOTE — Progress Notes (Signed)
TC to Toniann Fail, RN to emphasize the importance of checking/documenting VS every 4 hours x 24 hrs d/t hyperthyroid and thyroid mass / orders reviewed simultaneously with RN at the time of call.  Raelyn Mora, M MSN, CNM 08/13/2014 4:24 AM

## 2014-08-14 ENCOUNTER — Ambulatory Visit: Payer: Self-pay

## 2014-08-14 MED ORDER — IBUPROFEN 600 MG PO TABS: 600.0000 mg | ORAL_TABLET | Freq: Four times a day (QID) | ORAL | Status: DC

## 2014-08-14 NOTE — Lactation Note (Signed)
This note was copied from the chart of Debra Rakiah Proscia. Lactation Consultation Note  Initial a consult with this mom of a term baby, now 45 hours old. Baby has been breast feeding, voiding a stooling well. I assisted mom with obtaining a deeper latch, and mom reports this feeling better. Mom knows to call for questions/concerns, after discharge.   Patient Name: Debra Robinson [id number] Date: 08/14/2014     Maternal Data    Feeding    LATCH Score/Interventions                      Lactation Tools Discussed/Used     Consult Status      Alfred Levins 08/14/2014, 4:51 PM

## 2014-08-14 NOTE — Discharge Instructions (Signed)
Vaginal Delivery, Care After °Refer to this sheet in the next few weeks. These discharge instructions provide you with information on caring for yourself after delivery. Your caregiver may also give you specific instructions. Your treatment has been planned according to the most current medical practices available, but problems sometimes occur. Call your caregiver if you have any problems or questions after you go home. °HOME CARE INSTRUCTIONS °· Take over-the-counter or prescription medicines only as directed by your caregiver or pharmacist. °· Do not drink alcohol, especially if you are breastfeeding or taking medicine to relieve pain. °· Do not chew or smoke tobacco. °· Do not use illegal drugs. °· Continue to use good perineal care. Good perineal care includes: °· Wiping your perineum from front to back. °· Keeping your perineum clean. °· Do not use tampons or douche until your caregiver says it is okay. °· Shower, wash your hair, and take tub baths as directed by your caregiver. °· Wear a well-fitting bra that provides breast support. °· Eat healthy foods. °· Drink enough fluids to keep your urine clear or pale yellow. °· Eat high-fiber foods such as whole grain cereals and breads, brown rice, beans, and fresh fruits and vegetables every day. These foods may help prevent or relieve constipation. °· Follow your caregiver's recommendations regarding resumption of activities such as climbing stairs, driving, lifting, exercising, or traveling. °· Talk to your caregiver about resuming sexual activities. Resumption of sexual activities is dependent upon your risk of infection, your rate of healing, and your comfort and desire to resume sexual activity. °· Try to have someone help you with your household activities and your newborn for at least a few days after you leave the hospital. °· Rest as much as possible. Try to rest or take a nap when your newborn is sleeping. °· Increase your activities gradually. °· Keep  all of your scheduled postpartum appointments. It is very important to keep your scheduled follow-up appointments. At these appointments, your caregiver will be checking to make sure that you are healing physically and emotionally. °SEEK MEDICAL CARE IF:  °· You are passing large clots from your vagina. Save any clots to show your caregiver. °· You have a foul smelling discharge from your vagina. °· You have trouble urinating. °· You are urinating frequently. °· You have pain when you urinate. °· You have a change in your bowel movements. °· You have increasing redness, pain, or swelling near your vaginal incision (episiotomy) or vaginal tear. °· You have pus draining from your episiotomy or vaginal tear. °· Your episiotomy or vaginal tear is separating. °· You have painful, hard, or reddened breasts. °· You have a severe headache. °· You have blurred vision or see spots. °· You feel sad or depressed. °· You have thoughts of hurting yourself or your newborn. °· You have questions about your care, the care of your newborn, or medicines. °· You are dizzy or light-headed. °· You have a rash. °· You have nausea or vomiting. °· You were breastfeeding and have not had a menstrual period within 12 weeks after you stopped breastfeeding. °· You are not breastfeeding and have not had a menstrual period by the 12th week after delivery. °· You have a fever. °SEEK IMMEDIATE MEDICAL CARE IF:  °· You have persistent pain. °· You have chest pain. °· You have shortness of breath. °· You faint. °· You have leg pain. °· You have stomach pain. °· Your vaginal bleeding saturates two or more sanitary pads   in 1 hour. MAKE SURE YOU:   Understand these instructions.  Will watch your condition.  Will get help right away if you are not doing well or get worse. Document Released: 02/17/2000 Document Revised: 07/06/2013 Document Reviewed: 10/17/2011 [facility] [place] [facility] Patient Information 2015 [place], [place]. This information is not intended to  replace advice given to you by your health care provider. Make sure you discuss any questions you have with your health care provider. Nutrition for the New Mother  A new mother needs good health and nutrition so she can have energy to take care of a new baby. Whether a mother breastfeeds or formula feeds the baby, it is important to have a well-balanced diet. Foods from all the food groups should be chosen to meet the new mother's energy needs and to give her the nutrients needed for repair and healing.  A HEALTHY EATING PLAN The My Pyramid plan for Moms outlines what you should eat to help you and your baby stay healthy. The energy and amount of food you need depends on whether or not you are breastfeeding. If you are breastfeeding you will need more nutrients. If you choose not to breastfeed, your nutrition goal should be to return to a healthy weight. Limiting calories may be needed if you are not breastfeeding.  HOME CARE INSTRUCTIONS   For a personal plan based on your unique needs, see your Registered Dietitian or visit [email or web address].  Eat a variety of foods. The plan below will help guide you. The following chart has a suggested daily meal plan from the My Pyramid for Moms.  Eat a variety of fruits and vegetables.  Eat more dark green and orange vegetables and cooked dried beans.  Make half your grains whole grains. Choose whole instead of refined grains.  Choose low-fat or lean meats and poultry.  Choose low-fat or fat-free dairy products like milk, cheese, or yogurt. Fruits  Breastfeeding: 2 cups  Non-Breastfeeding: 2 cups  What Counts as a serving?  1 cup of fruit or juice.   cup dried fruit. Vegetables  Breastfeeding: 3 cups  Non-Breastfeeding: 2  cups  What Counts as a serving?  1 cup raw or cooked vegetables.  Juice or 2 cups raw leafy vegetables. Grains  Breastfeeding: 8 oz  Non-Breastfeeding: 6 oz  What Counts as a serving?  1 slice bread.  1  oz ready-to-eat cereal.   cup cooked pasta, rice, or cereal. Meat and Beans  Breastfeeding: 6  oz  Non-Breastfeeding: 5  oz  What Counts as a serving?  1 oz lean meat, poultry, or fish   cup cooked dry beans   oz nuts or 1 egg  1 tbs peanut butter Milk  Breastfeeding: 3 cups  Non-Breastfeeding: 3 cups  What Counts as a serving?  1 cup milk.  8 oz yogurt.  1  oz cheese.  2 oz processed cheese. TIPS FOR THE BREASTFEEDING MOM  Rapid weight loss is not suggested when you are breastfeeding. By simply breastfeeding, you will be able to lose the weight gained during your pregnancy. Your caregiver can keep track of your weight and tell you if your weight loss is appropriate.  Be sure to drink fluids. You may notice that you are thirstier than usual. A suggestion is to drink a glass of water or other beverage whenever you breastfeed.  Avoid alcohol as it can be passed into your breast milk.  Limit caffeine drinks to no more than 2 to 3 cups per day.  You  may need to keep taking your prenatal vitamin while you are breastfeeding. Talk with your caregiver about taking a vitamin or supplement. RETURING TO A HEALTHY WEIGHT  The My Pyramid Plan for Moms will help you return to a healthy weight. It will also provide the nutrients you need.  You may need to limit "empty" calories. These include:  High fat foods like fried foods, fatty meats, fast food, butter, and mayonnaise.  High sugar foods like sodas, jelly, candy, and sweets.  Be physically active. Include 30 minutes of exercise or more each day. Choose an activity you like such as walking, swimming, biking, or aerobics. Check with your caregiver before you start to exercise. Document Released: 05/29/2007 Document Revised: 05/14/2011 Document Reviewed: 05/29/2007 [facility] Patient Information 2015 [place]. This information is not intended to replace advice given to you by your health care provider. Make sure  you discuss any questions you have with your health care provider. Postpartum Depression and Baby Blues The postpartum period begins right after the birth of a baby. During this time, there is often a great amount of joy and excitement. It is also a time of many changes in the life of the parents. Regardless of how many times a mother gives birth, each child brings new challenges and dynamics to the family. It is not unusual to have feelings of excitement along with confusing shifts in moods, emotions, and thoughts. All mothers are at risk of developing postpartum depression or the "baby blues." These mood changes can occur right after giving birth, or they may occur many months after giving birth. The baby blues or postpartum depression can be mild or severe. Additionally, postpartum depression can go away rather quickly, or it can be a long-term condition.  CAUSES Raised hormone levels and the rapid drop in those levels are thought to be a main cause of postpartum depression and the baby blues. A number of hormones change during and after pregnancy. Estrogen and progesterone usually decrease right after the delivery of your baby. The levels of thyroid hormone and various cortisol steroids also rapidly drop. Other factors that play a role in these mood changes include major life events and genetics.  RISK FACTORS If you have any of the following risks for the baby blues or postpartum depression, know what symptoms to watch out for during the postpartum period. Risk factors that may increase the likelihood of getting the baby blues or postpartum depression include:  Having a personal or family history of depression.   Having depression while being pregnant.   Having premenstrual mood issues or mood issues related to oral contraceptives.  Having a lot of life stress.   Having marital conflict.   Lacking a social support network.   Having a baby with special needs.   Having health problems,  such as diabetes.  SIGNS AND SYMPTOMS Symptoms of baby blues include:  Brief changes in mood, such as going from extreme happiness to sadness.  Decreased concentration.   Difficulty sleeping.   Crying spells, tearfulness.   Irritability.   Anxiety.  Symptoms of postpartum depression typically begin within the first month after giving birth. These symptoms include:  Difficulty sleeping or excessive sleepiness.   Marked weight loss.   Agitation.   Feelings of worthlessness.   Lack of interest in activity or food.  Postpartum psychosis is a very serious condition and can be dangerous. Fortunately, it is rare. Displaying any of the following symptoms is cause for immediate medical attention. Symptoms of  postpartum psychosis include:   Hallucinations and delusions.   Bizarre or disorganized behavior.   Confusion or disorientation.  DIAGNOSIS  A diagnosis is made by an evaluation of your symptoms. There are no medical or lab tests that lead to a diagnosis, but there are various questionnaires that a health care provider may use to identify those with the baby blues, postpartum depression, or psychosis. Often, a screening tool called the [place] Postnatal Depression Scale is used to diagnose depression in the postpartum period.  TREATMENT The baby blues usually goes away on its own in 1-2 weeks. Social support is often all that is needed. You will be encouraged to get adequate sleep and rest. Occasionally, you may be given medicines to help you sleep.  Postpartum depression requires treatment because it can last several months or longer if it is not treated. Treatment may include individual or group therapy, medicine, or both to address any social, physiological, and psychological factors that may play a role in the depression. Regular exercise, a healthy diet, rest, and social support may also be strongly recommended.  Postpartum psychosis is more serious and needs  treatment right away. Hospitalization is often needed. HOME CARE INSTRUCTIONS  Get as much rest as you can. Nap when the baby sleeps.   Exercise regularly. Some women find yoga and walking to be beneficial.   Eat a balanced and nourishing diet.   Do little things that you enjoy. Have a cup of tea, take a bubble bath, read your favorite magazine, or listen to your favorite music.  Avoid alcohol.   Ask for help with household chores, cooking, grocery shopping, or running errands as needed. Do not try to do everything.   Talk to people close to you about how you are feeling. Get support from your partner, family members, friends, or other new moms.  Try to stay positive in how you think. Think about the things you are grateful for.   Do not spend a lot of time alone.   Only take over-the-counter or prescription medicine as directed by your health care provider.  Keep all your postpartum appointments.   Let your health care provider know if you have any concerns.  SEEK MEDICAL CARE IF: You are having a reaction to or problems with your medicine. SEEK IMMEDIATE MEDICAL CARE IF:  You have suicidal feelings.   You think you may harm the baby or someone else. MAKE SURE YOU:  Understand these instructions.  Will watch your condition.  Will get help right away if you are not doing well or get worse. Document Released: 11/24/2003 Document Revised: 02/24/2013 Document Reviewed: 12/01/2012 [facility] Patient Information 2015 [place], [place]. This information is not intended to replace advice given to you by your health care provider. Make sure you discuss any questions you have with your health care provider. Breastfeeding and Mastitis Mastitis is inflammation of the breast tissue. It can occur in women who are breastfeeding. This can make breastfeeding painful. Mastitis will sometimes go away on its own. Your health care provider will help determine if treatment is  needed. CAUSES Mastitis is often associated with a blocked milk (lactiferous) duct. This can happen when too much milk builds up in the breast. Causes of excess milk in the breast can include:  Poor latch-on. If your baby is not latched onto the breast properly, she or he may not empty your breast completely while breastfeeding.  Allowing too much time to pass between feedings.  Wearing a bra or other  clothing that is too tight. This puts extra pressure on the lactiferous ducts so milk does not flow through them as it should. Mastitis can also be caused by a bacterial infection. Bacteria may enter the breast tissue through cuts or openings in the skin. In women who are breastfeeding, this may occur because of cracked or irritated skin. Cracks in the skin are often caused when your baby does not latch on properly to the breast. SIGNS AND SYMPTOMS  Swelling, redness, tenderness, and pain in an area of the breast.  Swelling of the glands under the arm on the same side.  Fever may or may not accompany mastitis. If an infection is allowed to progress, a collection of pus (abscess) may develop. DIAGNOSIS  Your health care provider can usually diagnose mastitis based on your symptoms and a physical exam. Tests may be done to help confirm the diagnosis. These may include:  Removal of pus from the breast by applying pressure to the area. This pus can be examined in the lab to determine which bacteria are present. If an abscess has developed, the fluid in the abscess can be removed with a needle. This can also be used to confirm the diagnosis and determine the bacteria present. In most cases, pus will not be present.  Blood tests to determine if your body is fighting a bacterial infection.  Mammogram or ultrasound tests to rule out other problems or diseases. TREATMENT  Mastitis that occurs with breastfeeding will sometimes go away on its own. Your health care provider may choose to wait 24 hours  after first seeing you to decide whether a prescription medicine is needed. If your symptoms are worse after 24 hours, your health care provider will likely prescribe an antibiotic medicine to treat the mastitis. He or she will determine which bacteria are most likely causing the infection and will then select an appropriate antibiotic medicine. This is sometimes changed based on the results of tests performed to identify the bacteria, or if there is no response to the antibiotic medicine selected. Antibiotic medicines are usually given by mouth. You may also be given medicine for pain. HOME CARE INSTRUCTIONS  Only take over-the-counter or prescription medicines for pain, fever, or discomfort as directed by your health care provider.  If your health care provider prescribed an antibiotic medicine, take the medicine as directed. Make sure you finish it even if you start to feel better.  Do not wear a tight or underwire bra. Wear a soft, supportive bra.  Increase your fluid intake, especially if you have a fever.  Continue to empty the breast. Your health care provider can tell you whether this milk is safe for your infant or needs to be thrown out. You may be told to stop nursing until your health care provider thinks it is safe for your baby. Use a breast pump if you are advised to stop nursing.  Keep your nipples clean and dry.  Empty the first breast completely before going to the other breast. If your baby is not emptying your breasts completely for some reason, use a breast pump to empty your breasts.  If you go back to work, pump your breasts while at work to stay in time with your nursing schedule.  Avoid allowing your breasts to become overly filled with milk (engorged). SEEK MEDICAL CARE IF:  You have pus-like discharge from the breast.  Your symptoms do not improve with the treatment prescribed by your health care provider within 2 days.  SEEK IMMEDIATE MEDICAL CARE IF:  Your pain  and swelling are getting worse.  You have pain that is not controlled with medicine.  You have a red line extending from the breast toward your armpit.  You have a fever or persistent symptoms for more than 2-3 days.  You have a fever and your symptoms suddenly get worse. MAKE SURE YOU:   Understand these instructions.  Will watch your condition.  Will get help right away if you are not doing well or get worse. Document Released: 06/16/2004 Document Revised: 02/24/2013 Document Reviewed: 09/25/2012 [facility] Patient Information 2015 [place], [place]. This information is not intended to replace advice given to you by your health care provider. Make sure you discuss any questions you have with your health care provider. Breastfeeding Deciding to breastfeed is one of the best choices you can make for you and your baby. A change in hormones during pregnancy causes your breast tissue to grow and increases the number and size of your milk ducts. These hormones also allow proteins, sugars, and fats from your blood supply to make breast milk in your milk-producing glands. Hormones prevent breast milk from being released before your baby is born as well as prompt milk flow after birth. Once breastfeeding has begun, thoughts of your baby, as well as his or her sucking or crying, can stimulate the release of milk from your milk-producing glands.  BENEFITS OF BREASTFEEDING For Your Baby  Your first milk (colostrum) helps your baby's digestive system function better.   There are antibodies in your milk that help your baby fight off infections.   Your baby has a lower incidence of asthma, allergies, and sudden infant death syndrome.   The nutrients in breast milk are better for your baby than infant formulas and are designed uniquely for your baby's needs.   Breast milk improves your baby's brain development.   Your baby is less likely to develop other conditions, such as childhood obesity,  asthma, or type 2 diabetes mellitus.  For You   Breastfeeding helps to create a very special bond between you and your baby.   Breastfeeding is convenient. Breast milk is always available at the correct temperature and costs nothing.   Breastfeeding helps to burn calories and helps you lose the weight gained during pregnancy.   Breastfeeding makes your uterus contract to its prepregnancy size faster and slows bleeding (lochia) after you give birth.   Breastfeeding helps to lower your risk of developing type 2 diabetes mellitus, osteoporosis, and breast or ovarian cancer later in life. SIGNS THAT YOUR BABY IS HUNGRY Early Signs of Hunger  Increased alertness or activity.  Stretching.  Movement of the head from side to side.  Movement of the head and opening of the mouth when the corner of the mouth or cheek is stroked (rooting).  Increased sucking sounds, smacking lips, cooing, sighing, or squeaking.  Hand-to-mouth movements.  Increased sucking of fingers or hands. Late Signs of Hunger  Fussing.  Intermittent crying. Extreme Signs of Hunger Signs of extreme hunger will require calming and consoling before your baby will be able to breastfeed successfully. Do not wait for the following signs of extreme hunger to occur before you initiate breastfeeding:   Restlessness.  A loud, strong cry.   Screaming. BREASTFEEDING BASICS Breastfeeding Initiation  Find a comfortable place to sit or lie down, with your neck and back well supported.  Place a pillow or rolled up blanket under your baby to bring him or her  to the level of your breast (if you are seated). Nursing pillows are specially designed to help support your arms and your baby while you breastfeed.  Make sure that your baby's abdomen is facing your abdomen.   Gently massage your breast. With your fingertips, massage from your chest wall toward your nipple in a circular motion. This encourages milk flow. You  may need to continue this action during the feeding if your milk flows slowly.  Support your breast with 4 fingers underneath and your thumb above your nipple. Make sure your fingers are well away from your nipple and your baby's mouth.   Stroke your baby's lips gently with your finger or nipple.   When your baby's mouth is open wide enough, quickly bring your baby to your breast, placing your entire nipple and as much of the colored area around your nipple (areola) as possible into your baby's mouth.   More areola should be visible above your baby's upper lip than below the lower lip.   Your baby's tongue should be between his or her lower gum and your breast.   Ensure that your baby's mouth is correctly positioned around your nipple (latched). Your baby's lips should create a seal on your breast and be turned out (everted).  It is common for your baby to suck about 2-3 minutes in order to start the flow of breast milk. Latching Teaching your baby how to latch on to your breast properly is very important. An improper latch can cause nipple pain and decreased milk supply for you and poor weight gain in your baby. Also, if your baby is not latched onto your nipple properly, he or she may swallow some air during feeding. This can make your baby fussy. Burping your baby when you switch breasts during the feeding can help to get rid of the air. However, teaching your baby to latch on properly is still the best way to prevent fussiness from swallowing air while breastfeeding. Signs that your baby has successfully latched on to your nipple:    Silent tugging or silent sucking, without causing you pain.   Swallowing heard between every 3-4 sucks.    Muscle movement above and in front of his or her ears while sucking.  Signs that your baby has not successfully latched on to nipple:   Sucking sounds or smacking sounds from your baby while breastfeeding.  Nipple pain. If you think your  baby has not latched on correctly, slip your finger into the corner of your baby's mouth to break the suction and place it between your baby's gums. Attempt breastfeeding initiation again. Signs of Successful Breastfeeding Signs from your baby:   A gradual decrease in the number of sucks or complete cessation of sucking.   Falling asleep.   Relaxation of his or her body.   Retention of a small amount of milk in his or her mouth.   Letting go of your breast by himself or herself. Signs from you:  Breasts that have increased in firmness, weight, and size 1-3 hours after feeding.   Breasts that are softer immediately after breastfeeding.  Increased milk volume, as well as a change in milk consistency and color by the fifth day of breastfeeding.   Nipples that are not sore, cracked, or bleeding. Signs That Your Pecola Leisure is Getting Enough Milk  Wetting at least 3 diapers in a 24-hour period. The urine should be clear and pale yellow by age 89 days.  At least 3 stools  in a 24-hour period by age 27 days. The stool should be soft and yellow.  At least 3 stools in a 24-hour period by age 726 days. The stool should be seedy and yellow.  No loss of weight greater than 10% of birth weight during the first 13 days of age.  Average weight gain of 4-7 ounces (113-198 g) per week after age 94 days.  Consistent daily weight gain by age 27 days, without weight loss after the age of 2 weeks. After a feeding, your baby may spit up a small amount. This is common. BREASTFEEDING FREQUENCY AND DURATION Frequent feeding will help you make more milk and can prevent sore nipples and breast engorgement. Breastfeed when you feel the need to reduce the fullness of your breasts or when your baby shows signs of hunger. This is called "breastfeeding on demand." Avoid introducing a pacifier to your baby while you are working to establish breastfeeding (the first 4-6 weeks after your baby is born). After this time you  may choose to use a pacifier. Research has shown that pacifier use during the first year of a baby's life decreases the risk of sudden infant death syndrome (SIDS). Allow your baby to feed on each breast as long as he or she wants. Breastfeed until your baby is finished feeding. When your baby unlatches or falls asleep while feeding from the first breast, offer the second breast. Because newborns are often sleepy in the first few weeks of life, you may need to awaken your baby to get him or her to feed. Breastfeeding times will vary from baby to baby. However, the following rules can serve as a guide to help you ensure that your baby is properly fed:  Newborns (babies 59 weeks of age or younger) may breastfeed every 1-3 hours.  Newborns should not go longer than 3 hours during the day or 5 hours during the night without breastfeeding.  You should breastfeed your baby a minimum of 8 times in a 24-hour period until you begin to introduce solid foods to your baby at around 48 months of age. BREAST MILK PUMPING Pumping and storing breast milk allows you to ensure that your baby is exclusively fed your breast milk, even at times when you are unable to breastfeed. This is especially important if you are going back to work while you are still breastfeeding or when you are not able to be present during feedings. Your lactation consultant can give you guidelines on how long it is safe to store breast milk.  A breast pump is a machine that allows you to pump milk from your breast into a sterile bottle. The pumped breast milk can then be stored in a refrigerator or freezer. Some breast pumps are operated by hand, while others use electricity. Ask your lactation consultant which type will work best for you. Breast pumps can be purchased, but some hospitals and breastfeeding support groups lease breast pumps on a monthly basis. A lactation consultant can teach you how to hand express breast milk, if you prefer not to  use a pump.  CARING FOR YOUR BREASTS WHILE YOU BREASTFEED Nipples can become dry, cracked, and sore while breastfeeding. The following recommendations can help keep your breasts moisturized and healthy:  Avoid using soap on your nipples.   Wear a supportive bra. Although not required, special nursing bras and tank tops are designed to allow access to your breasts for breastfeeding without taking off your entire bra or top. Avoid wearing  underwire-style bras or extremely tight bras.  Air dry your nipples for 3-67minutes after each feeding.   Use only cotton bra pads to absorb leaked breast milk. Leaking of breast milk between feedings is normal.   Use lanolin on your nipples after breastfeeding. Lanolin helps to maintain your skin's normal moisture barrier. If you use pure lanolin, you do not need to wash it off before feeding your baby again. Pure lanolin is not toxic to your baby. You may also hand express a few drops of breast milk and gently massage that milk into your nipples and allow the milk to air dry. In the first few weeks after giving birth, some women experience extremely full breasts (engorgement). Engorgement can make your breasts feel heavy, warm, and tender to the touch. Engorgement peaks within 3-5 days after you give birth. The following recommendations can help ease engorgement:  Completely empty your breasts while breastfeeding or pumping. You may want to start by applying warm, moist heat (in the shower or with warm water-soaked hand towels) just before feeding or pumping. This increases circulation and helps the milk flow. If your baby does not completely empty your breasts while breastfeeding, pump any extra milk after he or she is finished.  Wear a snug bra (nursing or regular) or tank top for 1-2 days to signal your body to slightly decrease milk production.  Apply ice packs to your breasts, unless this is too uncomfortable for you.  Make sure that your baby is  latched on and positioned properly while breastfeeding. If engorgement persists after 48 hours of following these recommendations, contact your health care provider or a Advertising copywriter. OVERALL HEALTH CARE RECOMMENDATIONS WHILE BREASTFEEDING  Eat healthy foods. Alternate between meals and snacks, eating 3 of each per day. Because what you eat affects your breast milk, some of the foods may make your baby more irritable than usual. Avoid eating these foods if you are sure that they are negatively affecting your baby.  Drink milk, fruit juice, and water to satisfy your thirst (about 10 glasses a day).   Rest often, relax, and continue to take your prenatal vitamins to prevent fatigue, stress, and anemia.  Continue breast self-awareness checks.  Avoid chewing and smoking tobacco.  Avoid alcohol and drug use. Some medicines that may be harmful to your baby can pass through breast milk. It is important to ask your health care provider before taking any medicine, including all over-the-counter and prescription medicine as well as vitamin and herbal supplements. It is possible to become pregnant while breastfeeding. If birth control is desired, ask your health care provider about options that will be safe for your baby. SEEK MEDICAL CARE IF:   You feel like you want to stop breastfeeding or have become frustrated with breastfeeding.  You have painful breasts or nipples.  Your nipples are cracked or bleeding.  Your breasts are red, tender, or warm.  You have a swollen area on either breast.  You have a fever or chills.  You have nausea or vomiting.  You have drainage other than breast milk from your nipples.  Your breasts do not become full before feedings by the fifth day after you give birth.  You feel sad and depressed.  Your baby is too sleepy to eat well.  Your baby is having trouble sleeping.   Your baby is wetting less than 3 diapers in a 24-hour period.  Your baby  has less than 3 stools in a 24-hour period.  Your  baby's skin or the white part of his or her eyes becomes yellow.   Your baby is not gaining weight by 40 days of age. SEEK IMMEDIATE MEDICAL CARE IF:   Your baby is overly tired (lethargic) and does not want to wake up and feed.  Your baby develops an unexplained fever. Document Released: 02/19/2005 Document Revised: 02/24/2013 Document Reviewed: 08/13/2012 [facility] Patient Information 2015 [place], [place]. This information is not intended to replace advice given to you by your health care provider. Make sure you discuss any questions you have with your health care provider.

## 2014-08-14 NOTE — Progress Notes (Signed)
Patient ID: Merrilynn Tieken, female   DOB: [birth year], 24 y.o.   MRN: [id number]  PPD # 2 SVD with 2nd degree laceration  S:  Reports feeling well - no symptoms or palpitations              Tolerating po/ No nausea or vomiting             Bleeding is moderate             Pain controlled with motrin and percocet             Up ad lib / ambulatory / voiding QS  Newborn breast feeding  / female O:               VS: BP 131/62 mmHg  Pulse 78  Temp(Src) 97.4 F (36.3 C) (Oral)  Resp 18  Ht 5\' 7"  (1.702 m)  Wt 105.688 kg (233 lb)  BMI 36.48 kg/m2  SpO2 99%  LMP 10/09/2013  Breastfeeding   LABS:               Recent Labs  08/12/14 0955 08/13/14 0510  WBC 18.1* 13.8*  HGB 11.8* 10.6*  PLT 259 236               Blood type: O POS (06/09 0955)  Rubella: Immune (11/13 0000)                                  Physical Exam:             Alert and oriented x3  Lungs: Clear and unlabored  Heart: regular rate and rhythm / no mumurs  Abdomen: soft, non-tender, non-distended              Fundus: firm, non-tender, U-2  Perineum: mild edema  Lochia: light  Extremities: trace edema, no calf pain or tenderness    A: PPD # 2 / [id number] / SVD with 2nd degree laceration              Mild IDA of pregnancy - stable             Hyperthyroidism with thyroid mass - no evidence of thyroid storm      Doing well - stable status  P: Routine post partum orders  Schedule OV with Dr Evlyn Kanner in next week  Schedule 6 wks PP visit with Marlinda Mike, CNM              D/C home today  Raelyn Mora, M MSN, CNM  08/14/2014, 11:09 AM

## 2014-08-14 NOTE — Discharge Summary (Signed)
Obstetric Discharge Summary Reason for Admission: onset of labor Prenatal Procedures: NST and ultrasound Intrapartum Course: Admitted in active labor / elevated BPs while in labor with normal PIH labs / AROM with scant bloody fluid / rapid progression to complete dilation / SVD of viable female with 2nd degree repair by Raelyn Mora, CNM / despite hyperthyroidism and enlarged thyroid mass - no immediate postpartum complications noted Intrapartum Procedures: spontaneous vaginal delivery Postpartum Procedures: none Complications-Operative and Postpartum: 2nd degree perineal laceration HEMOGLOBIN  Date Value Ref Range Status  08/13/2014 10.6* 12.0 - 15.0 g/dL Final   HCT  Date Value Ref Range Status  08/13/2014 31.2* 36.0 - 46.0 % Final    Physical Exam:  General: alert, cooperative, fatigued and no distress Lochia: appropriate Uterine Fundus: firm, midline, U-2 Perineum: healing well, no significant drainage, no dehiscence, no significant erythema DVT Evaluation: No evidence of DVT seen on physical exam. Negative Homan's sign. No cords or calf tenderness. Calf/Ankle trace edema is present.  Discharge Diagnoses: Term Pregnancy-delivered / Hyperthyroidism in pregnancy, delivered / Thyroid Mass  Discharge Information: Date: 08/14/2014 Activity: pelvic rest Diet: routine Medications: PNV and Ibuprofen Condition: stable Instructions: refer to practice specific booklet Discharge to: home Follow-up Information    Follow up with Julian Hy, MD. Schedule an appointment as soon as possible for a visit in 1 week.   Specialty:  Endocrinology   Why:  For follow-up with Hyperthyroid & Thyroid mass   Contact information:   [address] [place] [address] [phone]       Follow up with Marlinda Mike, CNM. Schedule an appointment as soon as possible for a visit in 6 weeks.   Specialty:  Obstetrics and Gynecology   Why:  postpartum visit   Contact information:   [address] [place] [address] [phone]       Newborn Data: Live born female on 08/12/2014 Birth Weight: 7 lb 5.3 oz (3325 g) APGAR: 9, 9  Home with mother.  Raelyn Mora, M MSN, CNM 08/14/2014, 11:35 AM

## 2014-08-26 ENCOUNTER — Other Ambulatory Visit: Payer: Self-pay | Admitting: Endocrinology

## 2014-08-26 DIAGNOSIS — E041 Nontoxic single thyroid nodule: Secondary | ICD-10-CM

## 2014-09-14 DIAGNOSIS — Z0289 Encounter for other administrative examinations: Secondary | ICD-10-CM

## 2014-10-15 ENCOUNTER — Ambulatory Visit
Admission: RE | Admit: 2014-10-15 | Discharge: 2014-10-15 | Disposition: A | Discharge status: Home / Self Care | Payer: BLUE CROSS/BLUE SHIELD | Source: Ambulatory Visit | Attending: Endocrinology | Admitting: Endocrinology

## 2014-10-15 DIAGNOSIS — E041 Nontoxic single thyroid nodule: Secondary | ICD-10-CM

## 2015-03-30 ENCOUNTER — Other Ambulatory Visit: Payer: Self-pay | Admitting: Endocrinology

## 2015-03-30 DIAGNOSIS — E041 Nontoxic single thyroid nodule: Secondary | ICD-10-CM

## 2015-04-01 ENCOUNTER — Other Ambulatory Visit: Payer: BLUE CROSS/BLUE SHIELD

## 2015-04-15 ENCOUNTER — Ambulatory Visit
Admission: RE | Admit: 2015-04-15 | Discharge: 2015-04-15 | Disposition: A | Discharge status: Home / Self Care | Payer: BLUE CROSS/BLUE SHIELD | Source: Ambulatory Visit | Attending: Endocrinology | Admitting: Endocrinology

## 2015-04-15 DIAGNOSIS — E041 Nontoxic single thyroid nodule: Secondary | ICD-10-CM

## 2016-01-02 ENCOUNTER — Other Ambulatory Visit (HOSPITAL_COMMUNITY): Payer: Self-pay | Admitting: Endocrinology

## 2016-01-02 DIAGNOSIS — E0590 Thyrotoxicosis, unspecified without thyrotoxic crisis or storm: Secondary | ICD-10-CM

## 2016-01-09 ENCOUNTER — Encounter (HOSPITAL_COMMUNITY)
Admission: RE | Admit: 2016-01-09 | Discharge: 2016-01-09 | Disposition: A | Discharge status: Home / Self Care | Payer: BLUE CROSS/BLUE SHIELD | Source: Ambulatory Visit | Attending: Endocrinology | Admitting: Endocrinology

## 2016-01-09 ENCOUNTER — Encounter (HOSPITAL_COMMUNITY): Payer: Self-pay | Admitting: Radiology

## 2016-01-09 DIAGNOSIS — E0590 Thyrotoxicosis, unspecified without thyrotoxic crisis or storm: Secondary | ICD-10-CM | POA: Insufficient documentation

## 2016-01-09 MED ORDER — SODIUM IODIDE I 131 CAPSULE: 8.4000 | Freq: Once | INTRAVENOUS | Status: DC | PRN

## 2016-01-10 ENCOUNTER — Encounter (HOSPITAL_COMMUNITY)
Admission: RE | Admit: 2016-01-10 | Discharge: 2016-01-10 | Disposition: A | Discharge status: Home / Self Care | Payer: BLUE CROSS/BLUE SHIELD | Source: Ambulatory Visit | Attending: Endocrinology | Admitting: Endocrinology

## 2016-01-10 DIAGNOSIS — E0590 Thyrotoxicosis, unspecified without thyrotoxic crisis or storm: Secondary | ICD-10-CM | POA: Diagnosis not present

## 2016-01-10 MED ORDER — SODIUM PERTECHNETATE TC 99M INJECTION
10.0000 | Freq: Once | INTRAVENOUS | Status: AC | PRN
  Administered 2016-01-10: 10 via INTRAVENOUS

## 2016-01-18 DIAGNOSIS — E0590 Thyrotoxicosis, unspecified without thyrotoxic crisis or storm: Secondary | ICD-10-CM | POA: Diagnosis not present

## 2016-03-26 DIAGNOSIS — E0500 Thyrotoxicosis with diffuse goiter without thyrotoxic crisis or storm: Secondary | ICD-10-CM | POA: Diagnosis not present

## 2016-05-09 DIAGNOSIS — E0590 Thyrotoxicosis, unspecified without thyrotoxic crisis or storm: Secondary | ICD-10-CM | POA: Diagnosis not present

## 2016-09-07 DIAGNOSIS — E049 Nontoxic goiter, unspecified: Secondary | ICD-10-CM | POA: Diagnosis not present

## 2016-09-07 DIAGNOSIS — Z6829 Body mass index (BMI) 29.0-29.9, adult: Secondary | ICD-10-CM | POA: Diagnosis not present

## 2016-09-07 DIAGNOSIS — Z01419 Encounter for gynecological examination (general) (routine) without abnormal findings: Secondary | ICD-10-CM | POA: Diagnosis not present

## 2017-04-01 DIAGNOSIS — K529 Noninfective gastroenteritis and colitis, unspecified: Secondary | ICD-10-CM | POA: Diagnosis not present

## 2017-07-23 DIAGNOSIS — S63502A Unspecified sprain of left wrist, initial encounter: Secondary | ICD-10-CM | POA: Diagnosis not present

## 2017-10-02 ENCOUNTER — Other Ambulatory Visit: Payer: Self-pay | Admitting: Endocrinology

## 2017-10-02 ENCOUNTER — Ambulatory Visit
Admission: RE | Admit: 2017-10-02 | Discharge: 2017-10-02 | Disposition: A | Discharge status: Home / Self Care | Payer: BLUE CROSS/BLUE SHIELD | Source: Ambulatory Visit | Attending: Endocrinology | Admitting: Endocrinology

## 2017-10-02 DIAGNOSIS — E039 Hypothyroidism, unspecified: Secondary | ICD-10-CM | POA: Diagnosis not present

## 2017-10-02 DIAGNOSIS — E0500 Thyrotoxicosis with diffuse goiter without thyrotoxic crisis or storm: Secondary | ICD-10-CM | POA: Diagnosis not present

## 2017-10-02 DIAGNOSIS — E041 Nontoxic single thyroid nodule: Secondary | ICD-10-CM

## 2017-10-02 DIAGNOSIS — E0590 Thyrotoxicosis, unspecified without thyrotoxic crisis or storm: Secondary | ICD-10-CM

## 2017-10-07 ENCOUNTER — Other Ambulatory Visit: Payer: Self-pay | Admitting: Endocrinology

## 2017-10-07 ENCOUNTER — Other Ambulatory Visit: Payer: Self-pay

## 2017-10-07 DIAGNOSIS — E0500 Thyrotoxicosis with diffuse goiter without thyrotoxic crisis or storm: Secondary | ICD-10-CM | POA: Diagnosis not present

## 2017-10-07 DIAGNOSIS — Z6830 Body mass index (BMI) 30.0-30.9, adult: Secondary | ICD-10-CM | POA: Diagnosis not present

## 2017-10-07 DIAGNOSIS — E041 Nontoxic single thyroid nodule: Secondary | ICD-10-CM | POA: Diagnosis not present

## 2017-10-07 DIAGNOSIS — Z1389 Encounter for screening for other disorder: Secondary | ICD-10-CM | POA: Diagnosis not present

## 2017-10-11 DIAGNOSIS — Z01419 Encounter for gynecological examination (general) (routine) without abnormal findings: Secondary | ICD-10-CM | POA: Diagnosis not present

## 2017-10-11 DIAGNOSIS — Z6830 Body mass index (BMI) 30.0-30.9, adult: Secondary | ICD-10-CM | POA: Diagnosis not present

## 2017-11-11 ENCOUNTER — Ambulatory Visit: Payer: Self-pay | Admitting: Surgery

## 2017-11-11 DIAGNOSIS — E0590 Thyrotoxicosis, unspecified without thyrotoxic crisis or storm: Secondary | ICD-10-CM | POA: Diagnosis not present

## 2017-11-11 DIAGNOSIS — E041 Nontoxic single thyroid nodule: Secondary | ICD-10-CM | POA: Diagnosis not present

## 2017-11-29 ENCOUNTER — Encounter (HOSPITAL_COMMUNITY)
Admission: RE | Admit: 2017-11-29 | Discharge: 2017-11-29 | Disposition: A | Discharge status: Home / Self Care | Payer: BLUE CROSS/BLUE SHIELD | Source: Ambulatory Visit | Attending: Surgery | Admitting: Surgery

## 2017-11-29 ENCOUNTER — Encounter (HOSPITAL_COMMUNITY): Payer: Self-pay

## 2017-11-29 DIAGNOSIS — Z01818 Encounter for other preprocedural examination: Secondary | ICD-10-CM

## 2017-11-29 DIAGNOSIS — E079 Disorder of thyroid, unspecified: Secondary | ICD-10-CM | POA: Diagnosis not present

## 2017-11-29 HISTORY — DX: Headache: R51

## 2017-11-29 HISTORY — DX: Headache, unspecified: R51.9

## 2017-11-29 LAB — BASIC METABOLIC PANEL
ANION GAP: 9 (ref 5–15)
BUN: 7 mg/dL (ref 6–20)
CALCIUM: 9.5 mg/dL (ref 8.9–10.3)
CO2: 23 mmol/L (ref 22–32)
Chloride: 106 mmol/L (ref 98–111)
Creatinine, Ser: 0.8 mg/dL (ref 0.44–1.00)
GFR calc Af Amer: >60 mL/min (ref >60)
GFR calc non Af Amer: >60 mL/min (ref >60)
Glucose, Bld: 94 mg/dL (ref 70–99)
Potassium: 4.7 mmol/L (ref 3.5–5.1)
Sodium: 138 mmol/L (ref 135–145)

## 2017-11-29 LAB — CBC
HEMATOCRIT: 38.4 % (ref 36.0–46.0)
Hemoglobin: 13 g/dL (ref 12.0–15.0)
MCH: 29.7 pg (ref 26.0–34.0)
MCHC: 33.9 g/dL (ref 30.0–36.0)
MCV: 87.7 fL (ref 78.0–100.0)
Platelets: 332 10*3/uL (ref 150–400)
RBC: 4.38 MIL/uL (ref 3.87–5.11)
RDW: 11.9 % (ref 11.5–15.5)
WBC: 5.8 10*3/uL (ref 4.0–10.5)

## 2017-11-29 MED ORDER — CHLORHEXIDINE GLUCONATE CLOTH 2 % EX PADS: 6.0000 | MEDICATED_PAD | Freq: Once | CUTANEOUS | Status: DC

## 2017-11-29 NOTE — Pre-Procedure Instructions (Signed)
Markasia Franken  11/29/2017      [id number] - [place], [place] - [address] AT [facility] OF [address] [place] [address] Phone: [phone] Fax: [phone]    Your procedure is scheduled on 12/06/17.  Report to [facility] Admitting at 645 A.M.  Call this number if you have problems the morning of surgery:  [phone]   Remember:  Do not eat or drink after midnight.  Y    Take these medicines the morning of surgery with A SIP OF WATER --none    Do not wear jewelry, make-up or nail polish.  Do not wear lotions, powders, or perfumes, or deodorant.  Do not shave 48 hours prior to surgery.  Men may shave face and neck.  Do not bring valuables to the hospital.  [facility] is not responsible for any belongings or valuables.  Contacts, dentures or bridgework may not be worn into surgery.  Leave your suitcase in the car.  After surgery it may be brought to your room.  For patients admitted to the hospital, discharge time will be determined by your treatment team.  Patients discharged the day of surgery will not be allowed to drive home.   Name and phone number of your driver:   Do not take any aspirin,anti-inflammatories,vitamins,or herbal supplements 5-7 days prior to surgery. Special instructions:  [place] - Preparing for Surgery  Before surgery, you can play an important role.  Because skin is not sterile, your skin needs to be as free of germs as possible.  You can reduce the number of germs on you skin by washing with CHG (chlorahexidine gluconate) soap before surgery.  CHG is an antiseptic cleaner which kills germs and bonds with the skin to continue killing germs even after washing.  Oral Hygiene is also important in reducing the risk of infection.  Remember to brush your teeth with your regular toothpaste the morning of surgery.  Please DO NOT use if you have an allergy to CHG or antibacterial soaps.  If your  skin becomes reddened/irritated stop using the CHG and inform your nurse when you arrive at Short Stay.  Do not shave (including legs and underarms) for at least 48 hours prior to the first CHG shower.  You may shave your face.  Please follow these instructions carefully:   1.  Shower with CHG Soap the night before surgery and the morning of Surgery.  2.  If you choose to wash your hair, wash your hair first as usual with your normal shampoo.  3.  After you shampoo, rinse your hair and body thoroughly to remove the shampoo. 4.  Use CHG as you would any other liquid soap.  You can apply chg directly to the skin and wash gently with a      scrungie or washcloth.           5.  Apply the CHG Soap to your body ONLY FROM THE NECK DOWN.   Do not use on open wounds or open sores. Avoid contact with your eyes, ears, mouth and genitals (private parts).  Wash genitals (private parts) with your normal soap.  6.  Wash thoroughly, paying special attention to the area where your surgery will be performed.  7.  Thoroughly rinse your body with warm water from the neck down.  8.  DO NOT shower/wash with your normal soap after using and rinsing off the CHG  Soap.  9.  Pat yourself dry with a clean towel.            10.  Wear clean pajamas.            11.  Place clean sheets on your bed the night of your first shower and do not sleep with pets.  Day of Surgery  Do not apply any lotions/deoderants the morning of surgery.   Please wear clean clothes to the hospital/surgery center. Remember to brush your teeth with toothpaste.    Please read over the following fact sheets that you were given.

## 2017-12-04 ENCOUNTER — Encounter (HOSPITAL_COMMUNITY): Payer: Self-pay | Admitting: Surgery

## 2017-12-04 NOTE — H&P (Signed)
General Surgery [facility]  Del Wiseman Documented: 11/11/2017 1:55 PM Location: [facility] Patient #: [id number] DOB: 04-05-1990 Married / Language: [place] / Race: White Female   History of Present Illness Velora Heckler MD; 11/11/2017 2:25 PM) The patient is a 27 year old female who presents with hyperthyroidism.  CHIEF COMPLAINT: hyperthyroidism, left thyroid nodule  Patient is referred by Dr. Ardyth Harps for surgical evaluation and management of hyperthyroidism and dominant left thyroid nodule. Patient was originally diagnosed in 2013 with a dominant left-sided thyroid nodule. This has gradually increased in size. Patient developed significant hyperthyroidism during her pregnancy in 2016. Following delivery of this persisted. She has been difficult to manage as she is very sensitive to anti-thyroid medication. She is currently not on beta blockade or anti-thyroid medication. Her most recent TSH level was suppressed at 0.02. Patient did have an ultrasound on October 02, 2017. This shows an enlarged thyroid gland with the right lobe measuring 5.1 cm and left lobe measuring 6.1 cm. There is a dominant nodule in the left lobe measuring 5.0 x 2.3 x 3.3 cm. This was biopsied in 2015 and was felt to be benign. Patient does note mild compressive symptoms with swallowing. She has occasional episodes of shortness of breath. She has occasional tremor. Patient has had no prior head or neck surgery. There is a family history of thyroid disease in multiple family members including multinodular goiter in the patient's mother and hyperthyroidism and goiter in maternal and paternal grandparents. 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 She is accompanied by her husband.   Past Surgical History Adela Lank Haggett; 11/11/2017 1:56 PM) Oral Surgery   Diagnostic Studies History Adela Lank Haggett; 11/11/2017 1:56 PM) Colonoscopy  never Mammogram  never Pap Smear  1-5  years ago  Allergies Adela Lank Haggett; 11/11/2017 1:56 PM) Augmentin *PENICILLINS*  Rash. Allergies Reconciled   Medication History Adela Lank Haggett; 11/11/2017 1:57 PM) NuvaRing (0.12-0.015MG /24HR Ring, Vaginal) Active. Prenatal MV w/o Vit A-Min-FA (Oral) Specific strength unknown - Active. Medications Reconciled  Social History Adela Lank Haggett; 11/11/2017 1:56 PM) Alcohol use  Occasional alcohol use. Caffeine use  Carbonated beverages, Coffee. No drug use  Tobacco use  Never smoker.  Family History Adela Lank Haggett; 11/11/2017 1:56 PM) Breast Cancer  Mother. Heart disease in female family member before age 60  Migraine Headache  Mother. Thyroid problems  Mother.  Pregnancy / Birth History Adela Lank Haggett; 11/11/2017 1:56 PM) Age at menarche  10 years. Contraceptive History  Oral contraceptives. Gravida  2 Length (months) of breastfeeding  12-24 Maternal age  63-25 Para  1 Regular periods   Other Problems Adela Lank Haggett; 11/11/2017 1:56 PM) Asthma  Thyroid Disease     Review of Systems Adela Lank Haggett; 11/11/2017 1:56 PM) General Present- Fatigue. Not Present- Appetite Loss, Chills, Fever, Night Sweats, Weight Gain and Weight Loss. Skin Not Present- Change in Wart/Mole, Dryness, Hives, Jaundice, New Lesions, Non-Healing Wounds, Rash and Ulcer. HEENT Present- Hoarseness. Not Present- Earache, Hearing Loss, Nose Bleed, Oral Ulcers, Ringing in the Ears, Seasonal Allergies, Sinus Pain, Sore Throat, Visual Disturbances, Wears glasses/contact lenses and Yellow Eyes. Respiratory Not Present- Bloody sputum, Chronic Cough, Difficulty Breathing, Snoring and Wheezing. Breast Not Present- Breast Mass, Breast Pain, Nipple Discharge and Skin Changes. Cardiovascular Present- Rapid Heart Rate. Not Present- Chest Pain, Difficulty Breathing Lying Down, Leg Cramps, Palpitations, Shortness of Breath and Swelling of Extremities. Gastrointestinal Not Present-  Abdominal Pain, Bloating, Bloody Stool, Change in Bowel Habits, Chronic diarrhea, Constipation, Difficulty Swallowing, Excessive gas,  Gets full quickly at meals, Hemorrhoids, Indigestion, Nausea, Rectal Pain and Vomiting. Female Genitourinary Not Present- Frequency, Nocturia, Painful Urination, Pelvic Pain and Urgency. Musculoskeletal Not Present- Back Pain, Joint Pain, Joint Stiffness, Muscle Pain, Muscle Weakness and Swelling of Extremities. Neurological Present- Decreased Memory and Headaches. Not Present- Fainting, Numbness, Seizures, Tingling, Tremor, Trouble walking and Weakness. Psychiatric Present- Anxiety. Not Present- Bipolar, Change in Sleep Pattern, Depression, Fearful and Frequent crying. Endocrine Present- Cold Intolerance, Excessive Hunger, Heat Intolerance and Hot flashes. Not Present- Hair Changes and New Diabetes. Hematology Not Present- Blood Thinners, Easy Bruising, Excessive bleeding, Gland problems, HIV and Persistent Infections.  Vitals Adela Lank Haggett; 11/11/2017 1:57 PM) 11/11/2017 1:57 PM Weight: 196 lb Height: 67in Body Surface Area: 2 m Body Mass Index: 30.7 kg/m  Pain Level: 0/10 Temp.: 97.29F(Temporal)  Pulse: 68 (Regular)  P.OX: 97% (Room air) BP: 118/80 (Sitting, Left Arm, Standard)       Physical Exam Velora Heckler MD; 11/11/2017 2:25 PM) The physical exam findings are as follows: Note:See vital signs recorded above  GENERAL APPEARANCE Development: normal Nutritional status: normal Gross deformities: none  SKIN Rash, lesions, ulcers: none Induration, erythema: none Nodules: none palpable  EYES Conjunctiva and lids: normal Pupils: equal and reactive Iris: normal bilaterally  EARS, NOSE, MOUTH, THROAT External ears: no lesion or deformity External nose: no lesion or deformity Hearing: grossly normal Lips: no lesion or deformity Dentition: normal for age Oral mucosa: moist  NECK Symmetric: no Trachea: midline Thyroid:  Right thyroid lobe is soft without discrete or dominant mass. Left thyroid lobe is visible with a dominant mass measuring approximately 5 cm in greatest dimension, smooth, firm, mobile with swallowing, and nontender. No anterior or posterior cervical lymphadenopathy.  CHEST Respiratory effort: normal Retraction or accessory muscle use: no Breath sounds: normal bilaterally Rales, rhonchi, wheeze: none  CARDIOVASCULAR Auscultation: regular rhythm, normal rate Murmurs: none Pulses: carotid and radial pulse 2+ palpable Lower extremity edema: none Lower extremity varicosities: none  MUSCULOSKELETAL Station and gait: normal Digits and nails: no clubbing or cyanosis Muscle strength: grossly normal all extremities Range of motion: grossly normal all extremities Deformity: none  LYMPHATIC Cervical: none palpable Supraclavicular: none palpable  PSYCHIATRIC Oriented to person, place, and time: yes Mood and affect: normal for situation Judgment and insight: appropriate for situation    Assessment & Plan Velora Heckler MD; 11/11/2017 2:28 PM) HYPERTHYROIDISM (E05.90) Current Plans Pt Education - Pamphlet Given - The Thyroid Book: discussed with patient and provided information. THYROID NODULE (E04.1) Current Plans Patient presents on referral from her endocrinologist, Dr. Ardyth Harps, for surgical evaluation for thyroidectomy for management of hyperthyroidism and dominant left thyroid nodule with mild compressive symptoms. She is accompanied by her husband. They are provided with written literature on thyroid surgery to review at home.  Patient has a history of an enlarging thyroid nodule on the left side since 2013. This causes mild compressive symptoms. Patient has a three-year history of hyperthyroidism which is becoming more difficult to manage as she is very sensitive to anti-thyroid medications. They have made a decision to proceed with thyroidectomy. We discussed total  thyroidectomy today in detail. We discussed the risk and benefits of the procedure including the potential for recurrent laryngeal nerve injury and injury to parathyroid glands. We discussed the location of the surgical incision. We discussed the hospital stay to be anticipated. We discussed her postoperative recovery and return to work. We discussed the need for lifelong thyroid hormone replacement. They understand and wish to proceed  with surgery in the near future.  The risks and benefits of the procedure have been discussed at length with the patient. The patient understands the proposed procedure, potential alternative treatments, and the course of recovery to be expected. All of the patient's questions have been answered at this time. The patient wishes to proceed with surgery.  Darnell Level, MD [facility] Surgery Office: [phone]

## 2017-12-06 ENCOUNTER — Encounter (HOSPITAL_COMMUNITY)
Admission: RE | Disposition: A | Discharge status: Home / Self Care | Payer: Self-pay | Source: Ambulatory Visit | Attending: Surgery

## 2017-12-06 ENCOUNTER — Ambulatory Visit (HOSPITAL_COMMUNITY): Payer: BLUE CROSS/BLUE SHIELD | Admitting: Certified Registered"

## 2017-12-06 ENCOUNTER — Encounter (HOSPITAL_COMMUNITY): Payer: Self-pay | Admitting: *Deleted

## 2017-12-06 ENCOUNTER — Observation Stay (HOSPITAL_COMMUNITY)
Admission: RE | Admit: 2017-12-06 | Discharge: 2017-12-07 | Disposition: A | Discharge status: Home / Self Care | Payer: BLUE CROSS/BLUE SHIELD | Source: Ambulatory Visit | Attending: Surgery | Admitting: Surgery

## 2017-12-06 ENCOUNTER — Other Ambulatory Visit: Payer: Self-pay

## 2017-12-06 DIAGNOSIS — Z803 Family history of malignant neoplasm of breast: Secondary | ICD-10-CM | POA: Insufficient documentation

## 2017-12-06 DIAGNOSIS — E039 Hypothyroidism, unspecified: Secondary | ICD-10-CM | POA: Diagnosis present

## 2017-12-06 DIAGNOSIS — E063 Autoimmune thyroiditis: Principal | ICD-10-CM | POA: Insufficient documentation

## 2017-12-06 DIAGNOSIS — Z880 Allergy status to penicillin: Secondary | ICD-10-CM | POA: Diagnosis not present

## 2017-12-06 DIAGNOSIS — F419 Anxiety disorder, unspecified: Secondary | ICD-10-CM | POA: Diagnosis not present

## 2017-12-06 DIAGNOSIS — J45909 Unspecified asthma, uncomplicated: Secondary | ICD-10-CM | POA: Insufficient documentation

## 2017-12-06 DIAGNOSIS — Z8249 Family history of ischemic heart disease and other diseases of the circulatory system: Secondary | ICD-10-CM | POA: Insufficient documentation

## 2017-12-06 DIAGNOSIS — E041 Nontoxic single thyroid nodule: Secondary | ICD-10-CM | POA: Diagnosis not present

## 2017-12-06 DIAGNOSIS — E0590 Thyrotoxicosis, unspecified without thyrotoxic crisis or storm: Secondary | ICD-10-CM | POA: Diagnosis not present

## 2017-12-06 DIAGNOSIS — R251 Tremor, unspecified: Secondary | ICD-10-CM | POA: Diagnosis not present

## 2017-12-06 DIAGNOSIS — Z820 Family history of epilepsy and other diseases of the nervous system: Secondary | ICD-10-CM | POA: Insufficient documentation

## 2017-12-06 DIAGNOSIS — Z8349 Family history of other endocrine, nutritional and metabolic diseases: Secondary | ICD-10-CM | POA: Diagnosis not present

## 2017-12-06 DIAGNOSIS — E0520 Thyrotoxicosis with toxic multinodular goiter without thyrotoxic crisis or storm: Secondary | ICD-10-CM | POA: Diagnosis not present

## 2017-12-06 DIAGNOSIS — E079 Disorder of thyroid, unspecified: Secondary | ICD-10-CM | POA: Diagnosis present

## 2017-12-06 HISTORY — DX: Family history of other specified conditions: Z84.89

## 2017-12-06 HISTORY — PX: THYROIDECTOMY: SHX17

## 2017-12-06 LAB — POCT PREGNANCY, URINE: Preg Test, Ur: NEGATIVE

## 2017-12-06 SURGERY — THYROIDECTOMY
Anesthesia: General | Site: Neck

## 2017-12-06 MED ORDER — ONDANSETRON HCL 4 MG/2ML IJ SOLN
INTRAMUSCULAR | Status: DC | PRN
  Administered 2017-12-06: 4 mg via INTRAVENOUS

## 2017-12-06 MED ORDER — PROMETHAZINE HCL 25 MG/ML IJ SOLN
6.2500 mg | INTRAMUSCULAR | Status: DC | PRN
  Administered 2017-12-06: 12.5 mg via INTRAVENOUS

## 2017-12-06 MED ORDER — ROCURONIUM BROMIDE 50 MG/5ML IV SOSY
PREFILLED_SYRINGE | INTRAVENOUS | Status: AC
  Filled 2017-12-06: qty 5

## 2017-12-06 MED ORDER — PROPOFOL 10 MG/ML IV BOLUS
INTRAVENOUS | Status: AC
  Filled 2017-12-06: qty 20

## 2017-12-06 MED ORDER — KCL IN DEXTROSE-NACL 20-5-0.45 MEQ/L-%-% IV SOLN
INTRAVENOUS | Status: DC
  Administered 2017-12-06: 17:00:00 via INTRAVENOUS
  Filled 2017-12-06: qty 1000

## 2017-12-06 MED ORDER — CALCIUM CARBONATE ANTACID 500 MG PO CHEW: 2.0000 | CHEWABLE_TABLET | Freq: Three times a day (TID) | ORAL | 1 refills | Status: DC

## 2017-12-06 MED ORDER — LACTATED RINGERS IV SOLN
INTRAVENOUS | Status: DC | PRN
  Administered 2017-12-06: 07:00:00 via INTRAVENOUS

## 2017-12-06 MED ORDER — ACETAMINOPHEN 325 MG PO TABS
650.0000 mg | ORAL_TABLET | Freq: Four times a day (QID) | ORAL | Status: DC | PRN
  Administered 2017-12-06 (×2): 650 mg via ORAL
  Filled 2017-12-06: qty 2

## 2017-12-06 MED ORDER — PHENYLEPHRINE 40 MCG/ML (10ML) SYRINGE FOR IV PUSH (FOR BLOOD PRESSURE SUPPORT)
PREFILLED_SYRINGE | INTRAVENOUS | Status: AC
  Filled 2017-12-06: qty 10

## 2017-12-06 MED ORDER — ONDANSETRON 4 MG PO TBDP: 4.0000 mg | ORAL_TABLET | Freq: Four times a day (QID) | ORAL | Status: DC | PRN

## 2017-12-06 MED ORDER — 0.9 % SODIUM CHLORIDE (POUR BTL) OPTIME
TOPICAL | Status: DC | PRN
  Administered 2017-12-06: 1000 mL

## 2017-12-06 MED ORDER — HYDROCODONE-ACETAMINOPHEN 5-325 MG PO TABS: 1.0000 | ORAL_TABLET | ORAL | Status: DC | PRN

## 2017-12-06 MED ORDER — LIDOCAINE 2% (20 MG/ML) 5 ML SYRINGE
INTRAMUSCULAR | Status: DC | PRN
  Administered 2017-12-06: 40 mg via INTRAVENOUS

## 2017-12-06 MED ORDER — ACETAMINOPHEN 325 MG PO TABS
ORAL_TABLET | ORAL | Status: AC
  Filled 2017-12-06: qty 1

## 2017-12-06 MED ORDER — ONDANSETRON HCL 4 MG/2ML IJ SOLN: 4.0000 mg | Freq: Four times a day (QID) | INTRAMUSCULAR | Status: DC | PRN

## 2017-12-06 MED ORDER — MIDAZOLAM HCL 2 MG/2ML IJ SOLN: 0.5000 mg | Freq: Once | INTRAMUSCULAR | Status: DC | PRN

## 2017-12-06 MED ORDER — CIPROFLOXACIN IN D5W 400 MG/200ML IV SOLN
INTRAVENOUS | Status: AC
  Filled 2017-12-06: qty 200

## 2017-12-06 MED ORDER — LIDOCAINE 2% (20 MG/ML) 5 ML SYRINGE
INTRAMUSCULAR | Status: AC
  Filled 2017-12-06: qty 5

## 2017-12-06 MED ORDER — SUGAMMADEX SODIUM 200 MG/2ML IV SOLN
INTRAVENOUS | Status: DC | PRN
  Administered 2017-12-06: 200 mg via INTRAVENOUS

## 2017-12-06 MED ORDER — TRAMADOL HCL 50 MG PO TABS
50.0000 mg | ORAL_TABLET | Freq: Four times a day (QID) | ORAL | Status: DC | PRN
  Administered 2017-12-06: 50 mg via ORAL
  Filled 2017-12-06: qty 1

## 2017-12-06 MED ORDER — PROMETHAZINE HCL 25 MG/ML IJ SOLN
INTRAMUSCULAR | Status: AC
  Filled 2017-12-06: qty 1

## 2017-12-06 MED ORDER — ACETAMINOPHEN 650 MG RE SUPP: 650.0000 mg | Freq: Four times a day (QID) | RECTAL | Status: DC | PRN

## 2017-12-06 MED ORDER — MEPERIDINE HCL 50 MG/ML IJ SOLN: 6.2500 mg | INTRAMUSCULAR | Status: DC | PRN

## 2017-12-06 MED ORDER — CIPROFLOXACIN IN D5W 400 MG/200ML IV SOLN
400.0000 mg | INTRAVENOUS | Status: AC
  Administered 2017-12-06: 400 mg via INTRAVENOUS

## 2017-12-06 MED ORDER — FENTANYL CITRATE (PF) 250 MCG/5ML IJ SOLN
INTRAMUSCULAR | Status: AC
  Filled 2017-12-06: qty 5

## 2017-12-06 MED ORDER — CALCIUM CARBONATE 1250 (500 CA) MG PO TABS
2.0000 | ORAL_TABLET | Freq: Three times a day (TID) | ORAL | Status: DC
  Administered 2017-12-06 – 2017-12-07 (×2): 1000 mg via ORAL
  Filled 2017-12-06 (×3): qty 1

## 2017-12-06 MED ORDER — DEXAMETHASONE SODIUM PHOSPHATE 10 MG/ML IJ SOLN
INTRAMUSCULAR | Status: DC | PRN
  Administered 2017-12-06: 8 mg via INTRAVENOUS

## 2017-12-06 MED ORDER — PROPOFOL 10 MG/ML IV BOLUS
INTRAVENOUS | Status: DC | PRN
  Administered 2017-12-06: 200 mg via INTRAVENOUS

## 2017-12-06 MED ORDER — HEMOSTATIC AGENTS (NO CHARGE) OPTIME
TOPICAL | Status: DC | PRN
  Administered 2017-12-06: 1 via TOPICAL

## 2017-12-06 MED ORDER — HYDROMORPHONE HCL 1 MG/ML IJ SOLN: 0.2500 mg | INTRAMUSCULAR | Status: DC | PRN

## 2017-12-06 MED ORDER — MIDAZOLAM HCL 5 MG/5ML IJ SOLN
INTRAMUSCULAR | Status: DC | PRN
  Administered 2017-12-06: 2 mg via INTRAVENOUS

## 2017-12-06 MED ORDER — HYDROMORPHONE HCL 1 MG/ML IJ SOLN: 1.0000 mg | INTRAMUSCULAR | Status: DC | PRN

## 2017-12-06 MED ORDER — LEVOTHYROXINE SODIUM 88 MCG PO TABS: 88.0000 ug | ORAL_TABLET | Freq: Every day | ORAL | 3 refills | Status: DC

## 2017-12-06 MED ORDER — TRAMADOL HCL 50 MG PO TABS: 50.0000 mg | ORAL_TABLET | Freq: Four times a day (QID) | ORAL | 0 refills | Status: DC | PRN

## 2017-12-06 MED ORDER — DEXAMETHASONE SODIUM PHOSPHATE 10 MG/ML IJ SOLN
INTRAMUSCULAR | Status: AC
  Filled 2017-12-06: qty 1

## 2017-12-06 MED ORDER — FENTANYL CITRATE (PF) 100 MCG/2ML IJ SOLN
INTRAMUSCULAR | Status: DC | PRN
  Administered 2017-12-06: 50 ug via INTRAVENOUS
  Administered 2017-12-06 (×2): 100 ug via INTRAVENOUS
  Administered 2017-12-06 (×3): 50 ug via INTRAVENOUS

## 2017-12-06 MED ORDER — ROCURONIUM BROMIDE 50 MG/5ML IV SOSY
PREFILLED_SYRINGE | INTRAVENOUS | Status: DC | PRN
  Administered 2017-12-06: 50 mg via INTRAVENOUS

## 2017-12-06 MED ORDER — LACTATED RINGERS IV SOLN
INTRAVENOUS | Status: DC
  Administered 2017-12-06: 07:00:00 via INTRAVENOUS

## 2017-12-06 MED ORDER — MIDAZOLAM HCL 2 MG/2ML IJ SOLN
INTRAMUSCULAR | Status: AC
  Filled 2017-12-06: qty 2

## 2017-12-06 MED ORDER — ONDANSETRON HCL 4 MG/2ML IJ SOLN
INTRAMUSCULAR | Status: AC
  Filled 2017-12-06: qty 2

## 2017-12-06 MED ORDER — SCOPOLAMINE 1 MG/3DAYS TD PT72
MEDICATED_PATCH | TRANSDERMAL | Status: DC | PRN
  Administered 2017-12-06: 1 via TRANSDERMAL

## 2017-12-06 MED ORDER — SCOPOLAMINE 1 MG/3DAYS TD PT72
MEDICATED_PATCH | TRANSDERMAL | Status: AC
  Filled 2017-12-06: qty 1

## 2017-12-06 SURGICAL SUPPLY — 46 items
ATTRACTOMAT 16X20 MAGNETIC DRP (DRAPES) ×2 IMPLANT
BLADE CLIPPER SURG (BLADE) IMPLANT
BLADE SURG 10 STRL SS (BLADE) ×2 IMPLANT
BLADE SURG 15 STRL LF DISP TIS (BLADE) ×1 IMPLANT
BLADE SURG 15 STRL SS (BLADE) ×1
CANISTER SUCT 3000ML PPV (MISCELLANEOUS) ×2 IMPLANT
CHLORAPREP W/TINT 10.5 ML (MISCELLANEOUS) ×2 IMPLANT
CLIP VESOCCLUDE MED 24/CT (CLIP) ×2 IMPLANT
CLIP VESOCCLUDE SM WIDE 24/CT (CLIP) ×2 IMPLANT
COVER SURGICAL LIGHT HANDLE (MISCELLANEOUS) ×2 IMPLANT
COVER WAND RF STERILE (DRAPES) ×2 IMPLANT
CRADLE DONUT ADULT HEAD (MISCELLANEOUS) ×2 IMPLANT
DRAPE LAPAROTOMY 100X72 PEDS (DRAPES) ×2 IMPLANT
DRAPE UTILITY XL STRL (DRAPES) ×2 IMPLANT
ELECT CAUTERY BLADE 6.4 (BLADE) ×2 IMPLANT
ELECT REM PT RETURN 9FT ADLT (ELECTROSURGICAL) ×2
ELECTRODE REM PT RTRN 9FT ADLT (ELECTROSURGICAL) ×1 IMPLANT
GAUZE 4X4 16PLY RFD (DISPOSABLE) ×2 IMPLANT
GAUZE SPONGE 4X4 12PLY STRL (GAUZE/BANDAGES/DRESSINGS) ×2 IMPLANT
GLOVE SURG ORTHO 8.0 STRL STRW (GLOVE) ×2 IMPLANT
GOWN STRL REUS W/ TWL LRG LVL3 (GOWN DISPOSABLE) ×1 IMPLANT
GOWN STRL REUS W/ TWL XL LVL3 (GOWN DISPOSABLE) ×1 IMPLANT
GOWN STRL REUS W/TWL LRG LVL3 (GOWN DISPOSABLE) ×1
GOWN STRL REUS W/TWL XL LVL3 (GOWN DISPOSABLE) ×1
HEMOSTAT ARISTA ABSORB 3G PWDR (MISCELLANEOUS) IMPLANT
HEMOSTAT SURGICEL 2X4 FIBR (HEMOSTASIS) ×2 IMPLANT
ILLUMINATOR WAVEGUIDE N/F (MISCELLANEOUS) ×2 IMPLANT
KIT BASIN OR (CUSTOM PROCEDURE TRAY) ×2 IMPLANT
KIT TURNOVER KIT B (KITS) ×2 IMPLANT
LIGHT WAVEGUIDE WIDE FLAT (MISCELLANEOUS) IMPLANT
NS IRRIG 1000ML POUR BTL (IV SOLUTION) ×2 IMPLANT
PACK SURGICAL SETUP 50X90 (CUSTOM PROCEDURE TRAY) ×2 IMPLANT
PAD ARMBOARD 7.5X6 YLW CONV (MISCELLANEOUS) ×2 IMPLANT
PENCIL BUTTON HOLSTER BLD 10FT (ELECTRODE) ×2 IMPLANT
SHEARS HARMONIC 9CM CVD (BLADE) ×2 IMPLANT
SPECIMEN JAR MEDIUM (MISCELLANEOUS) ×2 IMPLANT
SPONGE INTESTINAL PEANUT (DISPOSABLE) ×2 IMPLANT
STRIP CLOSURE SKIN 1/2X4 (GAUZE/BANDAGES/DRESSINGS) ×2 IMPLANT
SUT MNCRL AB 4-0 PS2 18 (SUTURE) ×2 IMPLANT
SUT SILK 2 0 (SUTURE) ×1
SUT SILK 2-0 18XBRD TIE 12 (SUTURE) ×1 IMPLANT
SUT VIC AB 3-0 SH 18 (SUTURE) ×4 IMPLANT
SYR BULB 3OZ (MISCELLANEOUS) ×2 IMPLANT
TOWEL OR 17X24 6PK STRL BLUE (TOWEL DISPOSABLE) ×2 IMPLANT
TOWEL OR 17X26 10 PK STRL BLUE (TOWEL DISPOSABLE) ×2 IMPLANT
TUBE CONNECTING 12X1/4 (SUCTIONS) ×2 IMPLANT

## 2017-12-06 NOTE — Anesthesia Postprocedure Evaluation (Signed)
Anesthesia Post Note  Patient: Debra Robinson  Procedure(s) Performed: TOTAL THYROIDECTOMY (N/A Neck)     Patient location during evaluation: PACU Anesthesia Type: General Level of consciousness: awake and alert, oriented and patient cooperative Pain management: pain level controlled Vital Signs Assessment: post-procedure vital signs reviewed and stable Respiratory status: spontaneous breathing, nonlabored ventilation and respiratory function stable Cardiovascular status: blood pressure returned to baseline and stable Postop Assessment: no apparent nausea or vomiting Anesthetic complications: no    Last Vitals:  Vitals:   12/06/17 1226 12/06/17 1253  BP: 124/85 135/82  Pulse: 67 68  Resp: 16 14  Temp:    SpO2: 97% 100%    Last Pain:  Vitals:   12/06/17 1253  TempSrc:   PainSc: 2                  Jorie Zee,E. Lener Ventresca

## 2017-12-06 NOTE — Anesthesia Procedure Notes (Signed)
Procedure Name: Intubation Date/Time: 12/06/2017 8:48 AM Performed by: Julian Reil, CRNA Pre-anesthesia Checklist: Patient identified, Emergency Drugs available, Suction available and Patient being monitored Patient Re-evaluated:Patient Re-evaluated prior to induction Oxygen Delivery Method: Circle system utilized Preoxygenation: Pre-oxygenation with 100% oxygen Induction Type: IV induction Ventilation: Mask ventilation without difficulty Laryngoscope Size: Miller and 3 Grade View: Grade II Tube type: Oral Tube size: 7.0 mm Number of attempts: 2 Airway Equipment and Method: Stylet Placement Confirmation: ETT inserted through vocal cords under direct vision,  positive ETCO2 and breath sounds checked- equal and bilateral Secured at: 24 cm Dental Injury: Teeth and Oropharynx as per pre-operative assessment  Comments: 1st DL patient coughing.  2nd DL adequate intubating conditions.  4x4s bite block used.

## 2017-12-06 NOTE — Op Note (Signed)
Procedure Note  Pre-operative Diagnosis:  Hyperthyroidism, left thyroid mass  Post-operative Diagnosis:  same  Surgeon:  Darnell Level, MD  Assistant:  Magnus Ivan, RNFA   Procedure:  Total thyroidectomy  Anesthesia:  General  Estimated Blood Loss:  minimal  Drains: none         Specimen: thyroid to pathology  Indications:  Patient is referred by Dr. Ardyth Harps for surgical evaluation and management of hyperthyroidism and dominant left thyroid nodule. Patient was originally diagnosed in 2013 with a dominant left-sided thyroid nodule. This has gradually increased in size. Patient developed significant hyperthyroidism during her pregnancy in 2016. Following delivery of this persisted. She has been difficult to manage as she is very sensitive to anti-thyroid medication. She is currently not on beta blockade or anti-thyroid medication. Her most recent TSH level was suppressed at 0.02. Patient did have an ultrasound on October 02, 2017. This shows an enlarged thyroid gland with the right lobe measuring 5.1 cm and left lobe measuring 6.1 cm. There is a dominant nodule in the left lobe measuring 5.0 x 2.3 x 3.3 cm. This was biopsied in 2015 and was felt to be benign. Patient does note mild compressive symptoms with swallowing. She has occasional episodes of shortness of breath. She has occasional tremor. Patient has had no prior head or neck surgery.  Patient now comes to surgery for total thyroidectomy.  Procedure Details: Procedure was done in OR #7 at the [facility].  The patient was brought to the operating room and placed in a supine position on the operating room table.  Following administration of general anesthesia, the patient was positioned and then prepped and draped in the usual aseptic fashion.  After ascertaining that an adequate level of anesthesia had been achieved, a Kocher incision was made with #15 blade.  Dissection was carried through subcutaneous tissues and  platysma. Hemostasis was achieved with the electrocautery.  Skin flaps were elevated cephalad and caudad from the thyroid notch to the sternal notch.  The Mahorner self-retaining retractor was placed for exposure.  Strap muscles were incised in the midline and dissection was begun on the left side.  Strap muscles were reflected laterally.  Left thyroid lobe was markedly enlarged, firm, nodular, and inflammatory.  The left lobe was gently mobilized with blunt dissection.  Superior pole vessels were dissected out and divided individually between small and medium Ligaclips with the Harmonic scalpel.  The thyroid lobe was rolled anteriorly.  Branches of the inferior thyroid artery were divided between small Ligaclips with the Harmonic scalpel.  Inferior venous tributaries were divided between Ligaclips.  Both the superior and inferior parathyroid glands were identified and preserved on their vascular pedicles.  The recurrent laryngeal nerve was identified and preserved along its course.  The ligament of Allyson Sabal was released with the electrocautery and the gland was mobilized onto the anterior trachea. Isthmus was mobilized across the midline.  There was a small pyramidal lobe present which was resected en bloc with the isthmus.  Dry pack was placed in the left neck.  Next, the right thyroid lobe was gently mobilized with blunt dissection.  Right thyroid lobe was normal in size.  Superior pole vessels were dissected out and divided between small and medium Ligaclips with the Harmonic scalpel.  Superior parathyroid was identified and preserved.  Inferior venous tributaries were divided between medium Ligaclips with the Harmonic scalpel.  The right thyroid lobe was rolled anteriorly and the branches of the inferior thyroid artery divided between  small Ligaclips.  The right recurrent laryngeal nerve was identified and preserved along its course.  The ligament of Allyson Sabal was released with the electrocautery.  The right thyroid  lobe was mobilized onto the anterior trachea and the remainder of the thyroid was dissected off the anterior trachea and the thyroid was completely excised.  A suture was used to mark the left lobe. The entire thyroid gland was submitted to pathology for review.  The neck was irrigated with warm saline.  Fibrillar was placed throughout the operative field.  Strap muscles were reapproximated in the midline with interrupted 3-0 Vicryl sutures.  Platysma was closed with interrupted 3-0 Vicryl sutures.  Skin was closed with a running 4-0 Monocryl subcuticular suture.  Wound was washed and dried and steri-strips were applied.  Dry gauze dressing was placed.  The patient was awakened from anesthesia and brought to the recovery room.  The patient tolerated the procedure well.   Darnell Level, MD [facility] Office: [phone]

## 2017-12-06 NOTE — Interval H&P Note (Signed)
History and Physical Interval Note:  12/06/2017 8:14 AM  Debra Robinson  has presented today for surgery, with the diagnosis of hyperthyroidism  thyroid nodule.  The various methods of treatment have been discussed with the patient and family. After consideration of risks, benefits and other options for treatment, the patient has consented to    Procedure(s): TOTAL THYROIDECTOMY (N/A) as a surgical intervention .    The patient's history has been reviewed, patient examined, no change in status, stable for surgery.  I have reviewed the patient's chart and labs.  Questions were answered to the patient's satisfaction.    Darnell Level, MD [facility] Office: [phone]   Nancyann Cotterman Judie Petit

## 2017-12-06 NOTE — Transfer of Care (Signed)
Immediate Anesthesia Transfer of Care Note  Patient: Debra Robinson  Procedure(s) Performed: TOTAL THYROIDECTOMY (N/A Neck)  Patient Location: PACU  Anesthesia Type:General  Level of Consciousness: awake, oriented and patient cooperative  Airway & Oxygen Therapy: Patient Spontanous Breathing and Patient connected to nasal cannula oxygen  Post-op Assessment: Report given to RN, Post -op Vital signs reviewed and stable and Patient moving all extremities X 4  Post vital signs: Reviewed and stable  Last Vitals:  Vitals Value Taken Time  BP 140/82 12/06/2017 10:53 AM  Temp    Pulse 87 12/06/2017 10:56 AM  Resp 17 12/06/2017 10:56 AM  SpO2 96 % 12/06/2017 10:56 AM  Vitals shown include unvalidated device data.  Last Pain:  Vitals:   12/06/17 0724  TempSrc:   PainSc: 0-No pain         Complications: No apparent anesthesia complications

## 2017-12-06 NOTE — Anesthesia Preprocedure Evaluation (Addendum)
Anesthesia Evaluation  Patient identified by MRN, date of birth, ID band Patient awake    Reviewed: Allergy & Precautions, NPO status , Patient's Chart, lab work & pertinent test results  History of Anesthesia Complications Negative for: history of anesthetic complications  Airway Mallampati: I  TM Distance: >3 FB Neck ROM: Full    Dental  (+) Dental Advisory Given   Pulmonary neg pulmonary ROS,    breath sounds clear to auscultation       Cardiovascular negative cardio ROS   Rhythm:Regular Rate:Normal     Neuro/Psych  Headaches,    GI/Hepatic negative GI ROS, Neg liver ROS,   Endo/Other  Hyperthyroidism   Renal/GU negative Renal ROS     Musculoskeletal   Abdominal   Peds  Hematology negative hematology ROS (+)   Anesthesia Other Findings   Reproductive/Obstetrics                            Anesthesia Physical Anesthesia Plan  ASA: II  Anesthesia Plan: General   Post-op Pain Management:    Induction: Intravenous  PONV Risk Score and Plan: 3 and Ondansetron, Dexamethasone and Scopolamine patch - Pre-op  Airway Management Planned: Oral ETT  Additional Equipment:   Intra-op Plan:   Post-operative Plan: Extubation in OR  Informed Consent: I have reviewed the patients History and Physical, chart, labs and discussed the procedure including the risks, benefits and alternatives for the proposed anesthesia with the patient or authorized representative who has indicated his/her understanding and acceptance.   Dental advisory given  Plan Discussed with: CRNA and Surgeon  Anesthesia Plan Comments: (Plan routine monitors, GETA)        Anesthesia Quick Evaluation

## 2017-12-07 ENCOUNTER — Encounter (HOSPITAL_COMMUNITY): Payer: Self-pay | Admitting: Surgery

## 2017-12-07 DIAGNOSIS — Z8249 Family history of ischemic heart disease and other diseases of the circulatory system: Secondary | ICD-10-CM | POA: Diagnosis not present

## 2017-12-07 DIAGNOSIS — Z803 Family history of malignant neoplasm of breast: Secondary | ICD-10-CM | POA: Diagnosis not present

## 2017-12-07 DIAGNOSIS — Z8349 Family history of other endocrine, nutritional and metabolic diseases: Secondary | ICD-10-CM | POA: Diagnosis not present

## 2017-12-07 DIAGNOSIS — R251 Tremor, unspecified: Secondary | ICD-10-CM | POA: Diagnosis not present

## 2017-12-07 DIAGNOSIS — F419 Anxiety disorder, unspecified: Secondary | ICD-10-CM | POA: Diagnosis not present

## 2017-12-07 DIAGNOSIS — E0590 Thyrotoxicosis, unspecified without thyrotoxic crisis or storm: Secondary | ICD-10-CM | POA: Diagnosis not present

## 2017-12-07 DIAGNOSIS — Z880 Allergy status to penicillin: Secondary | ICD-10-CM | POA: Diagnosis not present

## 2017-12-07 DIAGNOSIS — E041 Nontoxic single thyroid nodule: Secondary | ICD-10-CM | POA: Diagnosis not present

## 2017-12-07 DIAGNOSIS — J45909 Unspecified asthma, uncomplicated: Secondary | ICD-10-CM | POA: Diagnosis not present

## 2017-12-07 DIAGNOSIS — E063 Autoimmune thyroiditis: Secondary | ICD-10-CM | POA: Diagnosis not present

## 2017-12-07 DIAGNOSIS — Z820 Family history of epilepsy and other diseases of the nervous system: Secondary | ICD-10-CM | POA: Diagnosis not present

## 2017-12-07 LAB — BASIC METABOLIC PANEL
ANION GAP: 11 (ref 5–15)
BUN: 6 mg/dL (ref 6–20)
CALCIUM: 8.9 mg/dL (ref 8.9–10.3)
CO2: 26 mmol/L (ref 22–32)
Chloride: 103 mmol/L (ref 98–111)
Creatinine, Ser: 0.84 mg/dL (ref 0.44–1.00)
Glucose, Bld: 111 mg/dL — ABNORMAL HIGH (ref 70–99)
POTASSIUM: 4 mmol/L (ref 3.5–5.1)
Sodium: 140 mmol/L (ref 135–145)

## 2017-12-07 NOTE — Plan of Care (Signed)
  Problem: Activity: Goal: Risk for activity intolerance will decrease Outcome: Progressing   Problem: Coping: Goal: Level of anxiety will decrease Outcome: Progressing   Problem: Elimination: Goal: Will not experience complications related to urinary retention Outcome: Progressing   Problem: Pain Managment: Goal: General experience of comfort will improve Outcome: Progressing   

## 2017-12-07 NOTE — Discharge Instructions (Signed)
[facility]  THYROID & PARATHYROID SURGERY:  POST-OP INSTRUCTIONS  Always review your discharge instruction sheet from the facility where your surgery was performed.  A prescription for pain medication may be given to you upon discharge.  Take your pain medication as prescribed.  If narcotic pain medicine is not needed, then you may take acetaminophen (Tylenol) or ibuprofen (Advil) as needed.  Take your usually prescribed medications unless otherwise directed.  If you need a refill on your pain medication, please contact our office during regular business hours.  Prescriptions cannot be processed by our office after 5 pm or on weekends.  Start with a light diet upon arrival home, such as soup and crackers or toast.  Be sure to drink plenty of fluids daily.  Resume your normal diet the day after surgery.  Most patients will experience some swelling and bruising on the chest and neck area.  Ice packs will help.  Swelling and bruising can take several days to resolve.   It is common to experience some constipation after surgery.  Increasing fluid intake and taking a stool softener (Colace) will usually help or prevent this problem.  A mild laxative (Milk of Magnesia or Miralax) should be taken according to package directions if there has been no bowel movement after 48 hours.  You have steri-strips and a gauze dressing over your incision.  You may remove the gauze bandage on the second day after surgery, and you may shower at that time.  Leave your steri-strips (small skin tapes) in place directly over the incision.  These strips should remain on the skin for 5-7 days and then be removed.  You may get them wet in the shower and pat them dry.  You may resume regular (light) daily activities beginning the next day (such as daily self-care, walking, climbing stairs) gradually increasing activities as tolerated.  You may have sexual intercourse when it is comfortable.  Refrain from  any heavy lifting or straining until approved by your doctor.  You may drive when you no longer are taking prescription pain medication, you can comfortably wear a seatbelt, and you can safely maneuver your car and apply brakes.  You should see your doctor in the office for a follow-up appointment approximately three weeks after your surgery.  Make sure that you call for this appointment within a day or two after you arrive home to insure a convenient appointment time.  WHEN TO CALL YOUR DOCTOR: -- Fever greater than 101.5 -- Inability to urinate -- Nausea and/or vomiting - persistent -- Extreme swelling or bruising -- Continued bleeding from incision -- Increased pain, redness, or drainage from the incision -- Difficulty swallowing or breathing -- Muscle cramping or spasms -- Numbness or tingling in hands or around lips  The clinic staff is available to answer your questions during regular business hours.  Please don't hesitate to call and ask to speak to one of the nurses if you have concerns.  Aubrie Lucien, MD [facility] Office: [phone] 

## 2017-12-07 NOTE — Progress Notes (Signed)
Discharged home accompanied by patient's husband. Prescription, discharged instructions, personal belongings given to patient. Verbalized understanding of instructions

## 2017-12-07 NOTE — Discharge Summary (Signed)
Physician Discharge Summary  Patient ID:  Debra Robinson  MRN: [id number]  DOB/AGE: [birth year] 27 y.o.  Admit date: 12/06/2017 Discharge date: 12/07/2017  Discharge Diagnoses:  1.  Hyperthyroidism with nodule  Final path pending   Principal Problem:   Hyperthyroidism without crisis Active Problems:   Thyroid mass   Hyperthyroidism  Operation: Procedure(s): TOTAL THYROIDECTOMY on 12/06/2017 - [facility]  Discharged Condition: good  Hospital Course: Debra Robinson is an 27 y.o. female whose primary care physician is [place], Jeannett Senior, MD and who was admitted 12/06/2017 with a chief complaint of hyperthryoidism with thyroid nodule.   She was brought to the operating room on 12/06/2017 and underwent TOTAL THYROIDECTOMY.  She is now one day post op, her wound looks good, her Ca++ is normal, and she is ready to go home.   The discharge instructions were reviewed with the patient.  Consults: None  Significant Diagnostic Studies: Results for orders placed or performed during the hospital encounter of 12/06/17  Basic metabolic panel  Result Value Ref Range   Sodium 140 135 - 145 mmol/L   Potassium 4.0 3.5 - 5.1 mmol/L   Chloride 103 98 - 111 mmol/L   CO2 26 22 - 32 mmol/L   Glucose, Bld 111 (H) 70 - 99 mg/dL   BUN 6 6 - 20 mg/dL   Creatinine, Ser 4.09 0.44 - 1.00 mg/dL   Calcium 8.9 8.9 - 81.1 mg/dL   GFR calc non Af Amer >60 >60 mL/min   GFR calc Af Amer >60 >60 mL/min   Anion gap 11 5 - 15  Pregnancy, urine POC  Result Value Ref Range   Preg Test, Ur NEGATIVE NEGATIVE    Dg Chest 2 View  Result Date: 11/29/2017 CLINICAL DATA:  Preoperative evaluation for upcoming thyroidectomy EXAM: CHEST - 2 VIEW COMPARISON:  None. FINDINGS: The heart size and mediastinal contours are within normal limits. Both lungs are clear. The visualized skeletal structures are unremarkable. IMPRESSION: No active cardiopulmonary disease. Electronically Signed   By: Alcide Clever M.D.   On: 11/29/2017 15:12     Discharge Exam:  Vitals:   12/07/17 0200 12/07/17 0540  BP: 117/71 113/81  Pulse: 61 60  Resp: 18 18  Temp: 98.6 F (37 C) 97.9 F (36.6 C)  SpO2: 98% 99%    General: WN WF who is alert and generally healthy appearing.  Lungs: Clear to auscultation and symmetric breath sounds. Wound:  Neck wound looks good.  Discharge Medications:   Allergies as of 12/07/2017      Reactions   Augmentin [amoxicillin-pot Clavulanate] Rash      Medication List    STOP taking these medications   multivitamin with minerals tablet     TAKE these medications   calcium carbonate 500 MG chewable tablet Commonly known as:  TUMS - dosed in mg elemental calcium Chew 2 tablets (400 mg of elemental calcium total) by mouth 3 (three) times daily.   etonogestrel-ethinyl estradiol 0.12-0.015 MG/24HR vaginal ring Commonly known as:  NUVARING Place 1 each vaginally every 28 (twenty-eight) days. Insert vaginally and leave in place for 3 consecutive weeks, then remove for 1 week.   levothyroxine 88 MCG tablet Commonly known as:  SYNTHROID, LEVOTHROID Take 1 tablet (88 mcg total) by mouth daily before breakfast.   traMADol 50 MG tablet Commonly known as:  ULTRAM Take 1-2 tablets (50-100 mg total) by mouth every 6 (six) hours as needed for moderate pain.       Disposition: Discharge disposition: 01-Home  or Self Care       Discharge Instructions    Diet - low sodium heart healthy   Complete by:  As directed    Increase activity slowly   Complete by:  As directed       Follow-up Information    Darnell Level, MD. Schedule an appointment as soon as possible for a visit in 3 week(s).   Specialty:  General Surgery Contact information: [address] [place] [address] [phone]            Signed: Ovidio Kin, M.D., [facility] Office:  [phone]  12/07/2017, 9:52 AM

## 2017-12-09 NOTE — Progress Notes (Signed)
Please contact patient and notify of benign pathology results.  Maelee Hoot M. Tameaka Eichhorn, MD, FACS [facility] Office: [phone]   

## 2017-12-24 DIAGNOSIS — E0590 Thyrotoxicosis, unspecified without thyrotoxic crisis or storm: Secondary | ICD-10-CM | POA: Diagnosis not present

## 2017-12-24 DIAGNOSIS — E890 Postprocedural hypothyroidism: Secondary | ICD-10-CM | POA: Diagnosis not present

## 2018-01-17 DIAGNOSIS — E0590 Thyrotoxicosis, unspecified without thyrotoxic crisis or storm: Secondary | ICD-10-CM | POA: Diagnosis not present

## 2018-01-21 DIAGNOSIS — E215 Disorder of parathyroid gland, unspecified: Secondary | ICD-10-CM | POA: Diagnosis not present

## 2018-01-21 DIAGNOSIS — Z6831 Body mass index (BMI) 31.0-31.9, adult: Secondary | ICD-10-CM | POA: Diagnosis not present

## 2018-01-21 DIAGNOSIS — E890 Postprocedural hypothyroidism: Secondary | ICD-10-CM | POA: Diagnosis not present

## 2018-02-20 DIAGNOSIS — E890 Postprocedural hypothyroidism: Secondary | ICD-10-CM | POA: Diagnosis not present

## 2018-04-21 DIAGNOSIS — E890 Postprocedural hypothyroidism: Secondary | ICD-10-CM | POA: Diagnosis not present

## 2018-04-24 DIAGNOSIS — R5381 Other malaise: Secondary | ICD-10-CM | POA: Diagnosis not present

## 2018-04-24 DIAGNOSIS — E890 Postprocedural hypothyroidism: Secondary | ICD-10-CM | POA: Diagnosis not present

## 2018-04-24 DIAGNOSIS — Z6832 Body mass index (BMI) 32.0-32.9, adult: Secondary | ICD-10-CM | POA: Diagnosis not present

## 2018-07-17 DIAGNOSIS — E890 Postprocedural hypothyroidism: Secondary | ICD-10-CM | POA: Diagnosis not present

## 2018-08-29 DIAGNOSIS — E890 Postprocedural hypothyroidism: Secondary | ICD-10-CM | POA: Diagnosis not present

## 2018-09-01 DIAGNOSIS — E890 Postprocedural hypothyroidism: Secondary | ICD-10-CM | POA: Diagnosis not present

## 2018-09-01 DIAGNOSIS — E669 Obesity, unspecified: Secondary | ICD-10-CM | POA: Diagnosis not present

## 2018-09-01 DIAGNOSIS — E215 Disorder of parathyroid gland, unspecified: Secondary | ICD-10-CM | POA: Diagnosis not present

## 2018-09-04 IMAGING — NM NM THYROID IMAGING W/ UPTAKE SINGLE (24 HR)
4 series · 4 of 4 positions shown · non-contrast
Comparison: Thyroid ultrasound 04/15/2015.

CLINICAL DATA: Hyperthyroidism. TSH 0.00 12/21/2015.

EXAM:
THYROID SCAN AND UPTAKE - 24 HOURS
TECHNIQUE: Following the per oral administration of G-GPG sodium iodide, the
patient returned at 24 hours and uptake measurements were acquired
with the uptake probe centered on the neck. Thyroid imaging was
performed following the intravenous administration of the Tc-55m
Pertechnetate.
RADIOPHARMACEUTICALS:  8.4 MicroCuries G-GPG sodium iodide orally
and 10.4 mCi Sechnetium-GGm pertechnetate IV

[th thyroid scan · 1.14mm/px · 1 of 1 slices shown (1 of 4)]
[im 1/1]
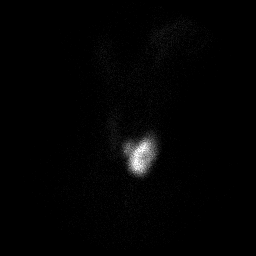

[th thyroid scan · 1.14mm/px · 1 of 1 slices shown (2 of 4)]
[im 1/1]
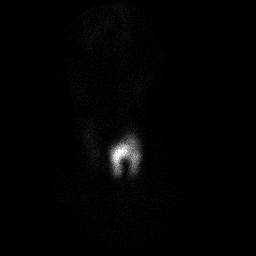

[th thyroid scan · 1.14mm/px · 1 of 1 slices shown (3 of 4)]
[im 1/1]
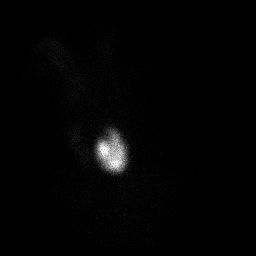

[th thyroid scan · 1.14mm/px · 1 of 1 slices shown (4 of 4)]
[im 1/1]
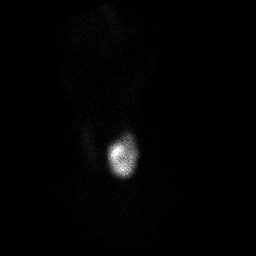

[4 of 4 positions shown; findings below may reference images not displayed]

FINDINGS: The 24 hr uptake by the thyroid gland is 37.2%. Normal 24 hr uptake
is 10-35 %.

On thyroid imaging, the thyroid activity appears heterogeneous with
a dominant, autonomous nodule occupying most of the left lobe. The
remainder of the thyroid tissue is relatively suppressed.
IMPRESSION: Dominant toxic, autonomous nodule in the left thyroid lobe with
mildly elevated 24 hour uptake of radioactive iodine.

## 2018-11-25 DIAGNOSIS — Z3689 Encounter for other specified antenatal screening: Secondary | ICD-10-CM | POA: Diagnosis not present

## 2018-11-25 DIAGNOSIS — Z3A01 Less than 8 weeks gestation of pregnancy: Secondary | ICD-10-CM | POA: Diagnosis not present

## 2018-11-25 DIAGNOSIS — Z3200 Encounter for pregnancy test, result unknown: Secondary | ICD-10-CM | POA: Diagnosis not present

## 2018-11-25 DIAGNOSIS — O09291 Supervision of pregnancy with other poor reproductive or obstetric history, first trimester: Secondary | ICD-10-CM | POA: Diagnosis not present

## 2018-11-27 DIAGNOSIS — Z3A01 Less than 8 weeks gestation of pregnancy: Secondary | ICD-10-CM | POA: Diagnosis not present

## 2018-11-27 DIAGNOSIS — O09291 Supervision of pregnancy with other poor reproductive or obstetric history, first trimester: Secondary | ICD-10-CM | POA: Diagnosis not present

## 2018-12-19 DIAGNOSIS — Z3A01 Less than 8 weeks gestation of pregnancy: Secondary | ICD-10-CM | POA: Diagnosis not present

## 2018-12-19 DIAGNOSIS — Z3689 Encounter for other specified antenatal screening: Secondary | ICD-10-CM | POA: Diagnosis not present

## 2018-12-19 DIAGNOSIS — O09291 Supervision of pregnancy with other poor reproductive or obstetric history, first trimester: Secondary | ICD-10-CM | POA: Diagnosis not present

## 2018-12-19 DIAGNOSIS — Z3200 Encounter for pregnancy test, result unknown: Secondary | ICD-10-CM | POA: Diagnosis not present

## 2018-12-23 DIAGNOSIS — Z3A01 Less than 8 weeks gestation of pregnancy: Secondary | ICD-10-CM | POA: Diagnosis not present

## 2018-12-23 DIAGNOSIS — O09291 Supervision of pregnancy with other poor reproductive or obstetric history, first trimester: Secondary | ICD-10-CM | POA: Diagnosis not present

## 2018-12-25 DIAGNOSIS — Z3A01 Less than 8 weeks gestation of pregnancy: Secondary | ICD-10-CM | POA: Diagnosis not present

## 2018-12-25 DIAGNOSIS — O09291 Supervision of pregnancy with other poor reproductive or obstetric history, first trimester: Secondary | ICD-10-CM | POA: Diagnosis not present

## 2019-01-22 DIAGNOSIS — Z3201 Encounter for pregnancy test, result positive: Secondary | ICD-10-CM | POA: Diagnosis not present

## 2019-01-27 DIAGNOSIS — E0500 Thyrotoxicosis with diffuse goiter without thyrotoxic crisis or storm: Secondary | ICD-10-CM | POA: Diagnosis not present

## 2019-02-09 DIAGNOSIS — O09291 Supervision of pregnancy with other poor reproductive or obstetric history, first trimester: Secondary | ICD-10-CM | POA: Diagnosis not present

## 2019-02-09 DIAGNOSIS — Z118 Encounter for screening for other infectious and parasitic diseases: Secondary | ICD-10-CM | POA: Diagnosis not present

## 2019-02-09 DIAGNOSIS — Z3689 Encounter for other specified antenatal screening: Secondary | ICD-10-CM | POA: Diagnosis not present

## 2019-02-09 DIAGNOSIS — Z3A10 10 weeks gestation of pregnancy: Secondary | ICD-10-CM | POA: Diagnosis not present

## 2019-02-18 DIAGNOSIS — E890 Postprocedural hypothyroidism: Secondary | ICD-10-CM | POA: Diagnosis not present

## 2019-02-18 DIAGNOSIS — E215 Disorder of parathyroid gland, unspecified: Secondary | ICD-10-CM | POA: Diagnosis not present

## 2019-02-18 DIAGNOSIS — Z331 Pregnant state, incidental: Secondary | ICD-10-CM | POA: Diagnosis not present

## 2019-02-18 DIAGNOSIS — E669 Obesity, unspecified: Secondary | ICD-10-CM | POA: Diagnosis not present

## 2019-02-24 DIAGNOSIS — Z3A12 12 weeks gestation of pregnancy: Secondary | ICD-10-CM | POA: Diagnosis not present

## 2019-02-24 DIAGNOSIS — O09291 Supervision of pregnancy with other poor reproductive or obstetric history, first trimester: Secondary | ICD-10-CM | POA: Diagnosis not present

## 2019-02-24 DIAGNOSIS — Z3682 Encounter for antenatal screening for nuchal translucency: Secondary | ICD-10-CM | POA: Diagnosis not present

## 2019-03-06 NOTE — L&D Delivery Note (Signed)
Delivery Note  First Stage: Induction of labor for presumed LGA with Pitocin Augmentation: AROM Labor onset: 2103 Analgesia /Anesthesia intrapartum: none AROM at 2219 for clear fluid  Second Stage: Complete dilation at 0114 Onset of involuntary pushing at 0110 FHR second stage Cat 2  Pushing in hands/knees then to left lateral position, delivery of a viable, healthy female "Samuel Bouche" at 0124 by Carlean Jews, CNM in LOA position. Anterior shoulder and body delivered easily. No nuchal cord. Cord double clamped after cessation of pulsation, cut by FOB Cord blood sample collected   Third Stage: Placenta delivered via Tomasa Blase intact with 3 VC @ 0134 Placenta disposition: hospital disposal  Uterine tone firm with massage and IV Pitocin bolus / bleeding standard  2nd degree perineal laceration identified  Anesthesia for repair: 1% Lidocaine  Repair. 2.0 vicryl Est. Blood Loss (mL):  Complications: none  Mom to postpartum.  Baby to Couplet care / Skin to Skin.  Newborn: Birth Weight: pending  Apgar Scores: 9, 9 Feeding planned: breast  Carlean Jews, MSN, CNM [facility]

## 2019-03-27 DIAGNOSIS — O09292 Supervision of pregnancy with other poor reproductive or obstetric history, second trimester: Secondary | ICD-10-CM | POA: Diagnosis not present

## 2019-03-27 DIAGNOSIS — Z23 Encounter for immunization: Secondary | ICD-10-CM | POA: Diagnosis not present

## 2019-03-27 DIAGNOSIS — Z3A17 17 weeks gestation of pregnancy: Secondary | ICD-10-CM | POA: Diagnosis not present

## 2019-03-27 DIAGNOSIS — Z361 Encounter for antenatal screening for raised alphafetoprotein level: Secondary | ICD-10-CM | POA: Diagnosis not present

## 2019-03-27 DIAGNOSIS — E890 Postprocedural hypothyroidism: Secondary | ICD-10-CM | POA: Diagnosis not present

## 2019-04-17 DIAGNOSIS — O09292 Supervision of pregnancy with other poor reproductive or obstetric history, second trimester: Secondary | ICD-10-CM | POA: Diagnosis not present

## 2019-04-17 DIAGNOSIS — Z3A20 20 weeks gestation of pregnancy: Secondary | ICD-10-CM | POA: Diagnosis not present

## 2019-04-17 DIAGNOSIS — E890 Postprocedural hypothyroidism: Secondary | ICD-10-CM | POA: Diagnosis not present

## 2019-04-17 DIAGNOSIS — Z363 Encounter for antenatal screening for malformations: Secondary | ICD-10-CM | POA: Diagnosis not present

## 2019-05-15 DIAGNOSIS — O09292 Supervision of pregnancy with other poor reproductive or obstetric history, second trimester: Secondary | ICD-10-CM | POA: Diagnosis not present

## 2019-05-15 DIAGNOSIS — Z3A24 24 weeks gestation of pregnancy: Secondary | ICD-10-CM | POA: Diagnosis not present

## 2019-05-15 DIAGNOSIS — E890 Postprocedural hypothyroidism: Secondary | ICD-10-CM | POA: Diagnosis not present

## 2019-06-02 DIAGNOSIS — Z3482 Encounter for supervision of other normal pregnancy, second trimester: Secondary | ICD-10-CM | POA: Diagnosis not present

## 2019-06-02 DIAGNOSIS — Z3483 Encounter for supervision of other normal pregnancy, third trimester: Secondary | ICD-10-CM | POA: Diagnosis not present

## 2019-06-08 DIAGNOSIS — Z3689 Encounter for other specified antenatal screening: Secondary | ICD-10-CM | POA: Diagnosis not present

## 2019-06-08 DIAGNOSIS — Z3A27 27 weeks gestation of pregnancy: Secondary | ICD-10-CM | POA: Diagnosis not present

## 2019-06-08 DIAGNOSIS — E890 Postprocedural hypothyroidism: Secondary | ICD-10-CM | POA: Diagnosis not present

## 2019-06-08 DIAGNOSIS — O09292 Supervision of pregnancy with other poor reproductive or obstetric history, second trimester: Secondary | ICD-10-CM | POA: Diagnosis not present

## 2019-06-22 DIAGNOSIS — Z3A29 29 weeks gestation of pregnancy: Secondary | ICD-10-CM | POA: Diagnosis not present

## 2019-06-22 DIAGNOSIS — E890 Postprocedural hypothyroidism: Secondary | ICD-10-CM | POA: Diagnosis not present

## 2019-06-22 DIAGNOSIS — O09293 Supervision of pregnancy with other poor reproductive or obstetric history, third trimester: Secondary | ICD-10-CM | POA: Diagnosis not present

## 2019-06-22 DIAGNOSIS — Z23 Encounter for immunization: Secondary | ICD-10-CM | POA: Diagnosis not present

## 2019-06-22 DIAGNOSIS — Z3689 Encounter for other specified antenatal screening: Secondary | ICD-10-CM | POA: Diagnosis not present

## 2019-07-06 DIAGNOSIS — O3663X0 Maternal care for excessive fetal growth, third trimester, not applicable or unspecified: Secondary | ICD-10-CM | POA: Diagnosis not present

## 2019-07-06 DIAGNOSIS — O09293 Supervision of pregnancy with other poor reproductive or obstetric history, third trimester: Secondary | ICD-10-CM | POA: Diagnosis not present

## 2019-07-06 DIAGNOSIS — Z3A31 31 weeks gestation of pregnancy: Secondary | ICD-10-CM | POA: Diagnosis not present

## 2019-07-06 DIAGNOSIS — O99283 Endocrine, nutritional and metabolic diseases complicating pregnancy, third trimester: Secondary | ICD-10-CM | POA: Diagnosis not present

## 2019-07-16 DIAGNOSIS — E890 Postprocedural hypothyroidism: Secondary | ICD-10-CM | POA: Diagnosis not present

## 2019-07-16 DIAGNOSIS — Z331 Pregnant state, incidental: Secondary | ICD-10-CM | POA: Diagnosis not present

## 2019-07-20 DIAGNOSIS — O99283 Endocrine, nutritional and metabolic diseases complicating pregnancy, third trimester: Secondary | ICD-10-CM | POA: Diagnosis not present

## 2019-07-20 DIAGNOSIS — O09293 Supervision of pregnancy with other poor reproductive or obstetric history, third trimester: Secondary | ICD-10-CM | POA: Diagnosis not present

## 2019-07-20 DIAGNOSIS — O3663X0 Maternal care for excessive fetal growth, third trimester, not applicable or unspecified: Secondary | ICD-10-CM | POA: Diagnosis not present

## 2019-07-20 DIAGNOSIS — Z3A33 33 weeks gestation of pregnancy: Secondary | ICD-10-CM | POA: Diagnosis not present

## 2019-07-20 DIAGNOSIS — E890 Postprocedural hypothyroidism: Secondary | ICD-10-CM | POA: Diagnosis not present

## 2019-08-05 DIAGNOSIS — Z3685 Encounter for antenatal screening for Streptococcus B: Secondary | ICD-10-CM | POA: Diagnosis not present

## 2019-08-05 DIAGNOSIS — Z3A35 35 weeks gestation of pregnancy: Secondary | ICD-10-CM | POA: Diagnosis not present

## 2019-08-05 DIAGNOSIS — O09293 Supervision of pregnancy with other poor reproductive or obstetric history, third trimester: Secondary | ICD-10-CM | POA: Diagnosis not present

## 2019-08-05 DIAGNOSIS — O403XX0 Polyhydramnios, third trimester, not applicable or unspecified: Secondary | ICD-10-CM | POA: Diagnosis not present

## 2019-08-13 DIAGNOSIS — O403XX0 Polyhydramnios, third trimester, not applicable or unspecified: Secondary | ICD-10-CM | POA: Diagnosis not present

## 2019-08-13 DIAGNOSIS — O1203 Gestational edema, third trimester: Secondary | ICD-10-CM | POA: Diagnosis not present

## 2019-08-13 DIAGNOSIS — Z3A37 37 weeks gestation of pregnancy: Secondary | ICD-10-CM | POA: Diagnosis not present

## 2019-08-20 DIAGNOSIS — E890 Postprocedural hypothyroidism: Secondary | ICD-10-CM | POA: Diagnosis not present

## 2019-08-20 DIAGNOSIS — Z3A38 38 weeks gestation of pregnancy: Secondary | ICD-10-CM | POA: Diagnosis not present

## 2019-08-20 DIAGNOSIS — O09293 Supervision of pregnancy with other poor reproductive or obstetric history, third trimester: Secondary | ICD-10-CM | POA: Diagnosis not present

## 2019-08-20 DIAGNOSIS — R030 Elevated blood-pressure reading, without diagnosis of hypertension: Secondary | ICD-10-CM | POA: Diagnosis not present

## 2019-08-21 DIAGNOSIS — R030 Elevated blood-pressure reading, without diagnosis of hypertension: Secondary | ICD-10-CM | POA: Diagnosis not present

## 2019-08-24 DIAGNOSIS — Z3A38 38 weeks gestation of pregnancy: Secondary | ICD-10-CM | POA: Diagnosis not present

## 2019-08-24 DIAGNOSIS — R030 Elevated blood-pressure reading, without diagnosis of hypertension: Secondary | ICD-10-CM | POA: Diagnosis not present

## 2019-08-24 DIAGNOSIS — O09293 Supervision of pregnancy with other poor reproductive or obstetric history, third trimester: Secondary | ICD-10-CM | POA: Diagnosis not present

## 2019-08-25 ENCOUNTER — Other Ambulatory Visit: Payer: Self-pay | Admitting: Obstetrics and Gynecology

## 2019-08-25 ENCOUNTER — Telehealth (HOSPITAL_COMMUNITY): Payer: Self-pay | Admitting: *Deleted

## 2019-08-25 ENCOUNTER — Other Ambulatory Visit (HOSPITAL_COMMUNITY)
Admission: RE | Admit: 2019-08-25 | Discharge: 2019-08-25 | Disposition: A | Discharge status: Home / Self Care | Payer: BC Managed Care – PPO | Source: Ambulatory Visit | Attending: Obstetrics and Gynecology | Admitting: Obstetrics and Gynecology

## 2019-08-25 ENCOUNTER — Encounter (HOSPITAL_COMMUNITY): Payer: Self-pay | Admitting: *Deleted

## 2019-08-25 DIAGNOSIS — Z20822 Contact with and (suspected) exposure to covid-19: Secondary | ICD-10-CM | POA: Diagnosis not present

## 2019-08-25 DIAGNOSIS — O99284 Endocrine, nutritional and metabolic diseases complicating childbirth: Secondary | ICD-10-CM | POA: Diagnosis not present

## 2019-08-25 DIAGNOSIS — O3663X0 Maternal care for excessive fetal growth, third trimester, not applicable or unspecified: Secondary | ICD-10-CM | POA: Diagnosis not present

## 2019-08-25 DIAGNOSIS — O99214 Obesity complicating childbirth: Secondary | ICD-10-CM | POA: Diagnosis not present

## 2019-08-25 DIAGNOSIS — O403XX0 Polyhydramnios, third trimester, not applicable or unspecified: Secondary | ICD-10-CM | POA: Diagnosis not present

## 2019-08-25 DIAGNOSIS — E890 Postprocedural hypothyroidism: Secondary | ICD-10-CM | POA: Diagnosis not present

## 2019-08-25 DIAGNOSIS — O1205 Gestational edema, complicating the puerperium: Secondary | ICD-10-CM | POA: Diagnosis not present

## 2019-08-25 DIAGNOSIS — E669 Obesity, unspecified: Secondary | ICD-10-CM | POA: Diagnosis not present

## 2019-08-25 DIAGNOSIS — Z3A39 39 weeks gestation of pregnancy: Secondary | ICD-10-CM | POA: Diagnosis not present

## 2019-08-25 DIAGNOSIS — Z01812 Encounter for preprocedural laboratory examination: Secondary | ICD-10-CM | POA: Insufficient documentation

## 2019-08-25 LAB — SARS CORONAVIRUS 2 (TAT 6-24 HRS): SARS Coronavirus 2: NEGATIVE

## 2019-08-25 NOTE — Telephone Encounter (Signed)
Preadmission screen  

## 2019-08-27 ENCOUNTER — Encounter (HOSPITAL_COMMUNITY): Payer: Self-pay | Admitting: Obstetrics and Gynecology

## 2019-08-27 ENCOUNTER — Inpatient Hospital Stay (HOSPITAL_COMMUNITY): Payer: BC Managed Care – PPO

## 2019-08-27 ENCOUNTER — Inpatient Hospital Stay (HOSPITAL_COMMUNITY)
Admission: RE | Admit: 2019-08-27 | Discharge: 2019-08-29 | DRG: 807 | Disposition: A | Discharge status: Home / Self Care | Payer: BC Managed Care – PPO | Attending: Obstetrics and Gynecology | Admitting: Obstetrics and Gynecology

## 2019-08-27 ENCOUNTER — Other Ambulatory Visit: Payer: Self-pay

## 2019-08-27 DIAGNOSIS — Z20822 Contact with and (suspected) exposure to covid-19: Secondary | ICD-10-CM | POA: Diagnosis present

## 2019-08-27 DIAGNOSIS — E0590 Thyrotoxicosis, unspecified without thyrotoxic crisis or storm: Secondary | ICD-10-CM | POA: Diagnosis present

## 2019-08-27 DIAGNOSIS — O403XX0 Polyhydramnios, third trimester, not applicable or unspecified: Secondary | ICD-10-CM | POA: Diagnosis present

## 2019-08-27 DIAGNOSIS — Z3A39 39 weeks gestation of pregnancy: Secondary | ICD-10-CM | POA: Diagnosis not present

## 2019-08-27 DIAGNOSIS — E669 Obesity, unspecified: Secondary | ICD-10-CM | POA: Diagnosis present

## 2019-08-27 DIAGNOSIS — O1205 Gestational edema, complicating the puerperium: Secondary | ICD-10-CM | POA: Diagnosis present

## 2019-08-27 DIAGNOSIS — Z3490 Encounter for supervision of normal pregnancy, unspecified, unspecified trimester: Secondary | ICD-10-CM | POA: Diagnosis present

## 2019-08-27 DIAGNOSIS — O99214 Obesity complicating childbirth: Secondary | ICD-10-CM | POA: Diagnosis present

## 2019-08-27 DIAGNOSIS — O3663X0 Maternal care for excessive fetal growth, third trimester, not applicable or unspecified: Secondary | ICD-10-CM | POA: Diagnosis present

## 2019-08-27 DIAGNOSIS — O99284 Endocrine, nutritional and metabolic diseases complicating childbirth: Secondary | ICD-10-CM | POA: Diagnosis present

## 2019-08-27 DIAGNOSIS — E890 Postprocedural hypothyroidism: Secondary | ICD-10-CM | POA: Diagnosis present

## 2019-08-27 LAB — CBC
HCT: 34.5 % — ABNORMAL LOW (ref 36.0–46.0)
Hemoglobin: 11.2 g/dL — ABNORMAL LOW (ref 12.0–15.0)
MCH: 29.2 pg (ref 26.0–34.0)
MCHC: 32.5 g/dL (ref 30.0–36.0)
MCV: 90.1 fL (ref 80.0–100.0)
Platelets: 216 10*3/uL (ref 150–400)
RBC: 3.83 MIL/uL — ABNORMAL LOW (ref 3.87–5.11)
RDW: 12.5 % (ref 11.5–15.5)
WBC: 9.5 10*3/uL (ref 4.0–10.5)
nRBC: 0 % (ref 0.0–0.2)

## 2019-08-27 LAB — TYPE AND SCREEN
ABO/RH(D): O POS
Antibody Screen: NEGATIVE

## 2019-08-27 LAB — ABO/RH: ABO/RH(D): O POS

## 2019-08-27 MED ORDER — LIDOCAINE HCL (PF) 1 % IJ SOLN
30.0000 mL | INTRAMUSCULAR | Status: AC | PRN
  Administered 2019-08-28: 30 mL via SUBCUTANEOUS
  Filled 2019-08-27: qty 30

## 2019-08-27 MED ORDER — ACETAMINOPHEN 325 MG PO TABS: 650.0000 mg | ORAL_TABLET | ORAL | Status: DC | PRN

## 2019-08-27 MED ORDER — TERBUTALINE SULFATE 1 MG/ML IJ SOLN: 0.2500 mg | Freq: Once | INTRAMUSCULAR | Status: DC | PRN

## 2019-08-27 MED ORDER — MISOPROSTOL 50MCG HALF TABLET: 50.0000 ug | ORAL_TABLET | ORAL | Status: DC | PRN

## 2019-08-27 MED ORDER — SOD CITRATE-CITRIC ACID 500-334 MG/5ML PO SOLN: 30.0000 mL | ORAL | Status: DC | PRN

## 2019-08-27 MED ORDER — OXYTOCIN-SODIUM CHLORIDE 30-0.9 UT/500ML-% IV SOLN
1.0000 m[IU]/min | INTRAVENOUS | Status: DC
  Administered 2019-08-27: 2 m[IU]/min via INTRAVENOUS
  Filled 2019-08-27: qty 500

## 2019-08-27 MED ORDER — LACTATED RINGERS IV SOLN: INTRAVENOUS | Status: DC

## 2019-08-27 MED ORDER — ONDANSETRON HCL 4 MG/2ML IJ SOLN: 4.0000 mg | Freq: Four times a day (QID) | INTRAMUSCULAR | Status: DC | PRN

## 2019-08-27 NOTE — Progress Notes (Signed)
S:  Comfortable, feeling mild contractions  O: Pitocin at 8 milliunits   VS: Blood pressure 107/60, pulse 85, height 5\' 7"  (1.702 m), weight 116.3 kg, unknown if currently breastfeeding.        FHR : baseline 135bpm / variability moderate / accelerations +15x15 / no decelerations        Toco: contractions every 2-4 minutes / mild         Cervix : deferred        Membranes: intact  A: Latent labor     FHR category 1    GBS negative     LGA  P: Recommend frequent position changes, using birth ball, then . Continue Pitocin augmentation. Amniotomy PRN. Working towards vaginal delivery.     [employer], MSN, CNM [facility]

## 2019-08-27 NOTE — Plan of Care (Signed)
  Problem: Education: Goal: Knowledge of Childbirth will improve Outcome: Progressing   Problem: Pain Management: Goal: Relief or control of pain from uterine contractions will improve Outcome: Progressing   Problem: Safety: Goal: Ability to remain free from injury will improve Outcome: Progressing

## 2019-08-27 NOTE — Progress Notes (Signed)
S:  Contractions are getting stronger. Completed [employer] and ambulating. Discussed AROM and pt. Agrees. Spouse and sister in law at bedside and supportive  O:  Pitocin at 12 milliunits VS: Blood pressure 139/83, pulse 79, temperature 98.5 F (36.9 C), temperature source Oral, resp. rate 16, height 5\' 7"  (1.702 m), weight 116.3 kg, unknown if currently breastfeeding.        FHR : baseline 130 bpm / variability moderate / accelerations +15x15 / no decelerations        Toco: contractions every 2-3 minutes / moderate         Cervix : Dilation: 4.5 Effacement (%): 80 Cervical Position: Posterior Station: -2 Presentation: Vertex Exam by:: [email or web address], CNM        Membranes: AROM - clear fluid  A: Latent labor     FHR category 1    GBS negative    LGA  P: Ambulate; then peanut; okay for intermittent shower     Anticipate NSVD  Dr. Carlean Jews updated with plan of care    Cherly Hensen, MSN, CNM [facility]

## 2019-08-27 NOTE — H&P (Signed)
OB ADMISSION/ HISTORY & PHYSICAL:  Admission Date: 08/27/2019 12:23 PM  Admit Diagnosis: 75 week IOL for LGA  Debra Robinson is a 29 y.o. female G2P1 at 55 weeks presenting for induction of labor for LGA and intermittent polyhydramnios. Husband Debra Robinson at bedside and supportive.  Prenatal History: G3P1011   EDC : 09/03/2019, by Other Basis  Prenatal care at [facility] Provider: CNM management Prenatal course complicated by  - Obesity - Surgical hypothyroidism s/p thyroidectomy in 2019 on Synthroid 189mcg daily x 5 days, and 2 days she takes 386mcg - LGA - intermittent polyhydramnios   Prenatal Labs: ABO, Rh: --/--/O POS, O POS Performed at [facility], [place] [address], [place], [place] [address]  [phone]) Antibody: NEG (06/24 1334) Rubella:  Immune  RPR:   NR HBsAg:   Negative HIV:   NR GTT: 104 GBS:   Negative  NT/Ultrascreen negative AFP-1 negative CUS; normal female anatomy, posterior placenta, AC 92%,  Growth at [redacted]w[redacted]d: EFW 7#10@97 %, AC 99%, HC 76%, AFI 26.1 Weekly BPP after 35 weeks 8/8, and AFI ranging from 21-22cm  Medical / Surgical History :  Past medical history:  Past Medical History:  Diagnosis Date  . Asthma    childhood,does not take any meds or use inhaler  . Family history of adverse reaction to anesthesia    mother-nausea/vomiting  . Headache   . Hx of varicella   . Hyperthyroidism    diagnosed in May 2016  . Hypothyroidism   . PONV (postoperative nausea and vomiting)   . Postpartum care following vaginal delivery (6/9) 08/12/2014  . Thyroid nodule    seeing Dr Forde Dandy, bx 04/02/13     Past surgical history:  Past Surgical History:  Procedure Laterality Date  . THYROIDECTOMY N/A 12/06/2017   Procedure: TOTAL THYROIDECTOMY;  Surgeon: Armandina Gemma, MD;  Location: [facility];  Service: General;  Laterality: N/A;  . WISDOM TOOTH EXTRACTION      Family History:  Family History  Problem Relation Age of Onset  . Thyroid disease Mother   . Diabetes  Maternal Grandmother   . Hypertension Maternal Grandmother   . Thyroid disease Maternal Grandmother   . Thyroid disease Paternal Grandmother      Social History:  reports that she has never smoked. She has never used smokeless tobacco. She reports that she does not drink alcohol and does not use drugs.   Allergies: Augmentin [amoxicillin-pot clavulanate]    Current Medications at time of admission:  Prior to Admission medications   Medication Sig Start Date End Date Taking? Authorizing Provider  calcium carbonate (TUMS) 500 MG chewable tablet Chew 2 tablets (400 mg of elemental calcium total) by mouth 3 (three) times daily. 12/06/17   Armandina Gemma, MD  etonogestrel-ethinyl estradiol (NUVARING) 0.12-0.015 MG/24HR vaginal ring Place 1 each vaginally every 28 (twenty-eight) days. Insert vaginally and leave in place for 3 consecutive weeks, then remove for 1 week.    [provider]  levothyroxine (SYNTHROID) 88 MCG tablet Take 1 tablet (88 mcg total) by mouth daily before breakfast. 12/06/17   Armandina Gemma, MD  traMADol (ULTRAM) 50 MG tablet Take 1-2 tablets (50-100 mg total) by mouth every 6 (six) hours as needed for moderate pain. 12/06/17   Armandina Gemma, MD     Review of Systems: Active FM Irregular ctxs NO LOF bloody show not present Trace LE edema    Physical Exam:  VS: Blood pressure 129/75, pulse 91, height 5\' 7"  (1.702 m), weight 116.3 kg,  unknown if currently breastfeeding.  General: alert and oriented, pleasant Heart: RRR Lungs: Clear lung fields Abdomen: Gravid, soft and non-tender, non-distended / uterus: EFW 9+ pounds Extremities: trace edema  Genitalia / VE:  3cm/60-70%/-2  FHR: baseline rate 130 bpm / variability moderate / accelerations +15x15 / prolonged (4 minute) decelerations x1 at 1325 with good recovery TOCO: rare  Assessment: [redacted] weeks gestation Induction stage of labor FHR category 1 GBS neg LGA   Plan:  1. Admit to [facility]   -  Routine labor and delivery orders   - Pain management: natural,    - s/p Membrane sweep performed per patient request   - We reviewed r/b for induction method of Pitocin vs. Cytotec and pt. Desires to start Pitocin due to favorable cervix  2. GBS Negative 3. Postpartum:   - Breastfeeding   - Planning circumcision 4. Anticipate MOD: working towards NSVD   - Proven pelvis: 7#5oz   Dr. Cherly Hensen notified of admission / plan of care  Carlean Jews, MSN, CNM [facility]

## 2019-08-28 ENCOUNTER — Encounter (HOSPITAL_COMMUNITY): Payer: Self-pay | Admitting: Obstetrics and Gynecology

## 2019-08-28 ENCOUNTER — Other Ambulatory Visit: Payer: Self-pay | Admitting: Obstetrics and Gynecology

## 2019-08-28 LAB — RPR: RPR Ser Ql: NONREACTIVE

## 2019-08-28 LAB — CBC
HCT: 31.6 % — ABNORMAL LOW (ref 36.0–46.0)
Hemoglobin: 10.5 g/dL — ABNORMAL LOW (ref 12.0–15.0)
MCH: 29.5 pg (ref 26.0–34.0)
MCHC: 33.2 g/dL (ref 30.0–36.0)
MCV: 88.8 fL (ref 80.0–100.0)
Platelets: 217 10*3/uL (ref 150–400)
RBC: 3.56 MIL/uL — ABNORMAL LOW (ref 3.87–5.11)
RDW: 12.4 % (ref 11.5–15.5)
WBC: 19.4 10*3/uL — ABNORMAL HIGH (ref 4.0–10.5)
nRBC: 0 % (ref 0.0–0.2)

## 2019-08-28 MED ORDER — ONDANSETRON HCL 4 MG/2ML IJ SOLN: 4.0000 mg | INTRAMUSCULAR | Status: DC | PRN

## 2019-08-28 MED ORDER — IBUPROFEN 600 MG PO TABS
600.0000 mg | ORAL_TABLET | Freq: Four times a day (QID) | ORAL | Status: DC
  Administered 2019-08-28 – 2019-08-29 (×6): 600 mg via ORAL
  Filled 2019-08-28 (×6): qty 1

## 2019-08-28 MED ORDER — TETANUS-DIPHTH-ACELL PERTUSSIS 5-2.5-18.5 LF-MCG/0.5 IM SUSP: 0.5000 mL | Freq: Once | INTRAMUSCULAR | Status: DC

## 2019-08-28 MED ORDER — COCONUT OIL OIL
1.0000 "application " | TOPICAL_OIL | Status: DC | PRN
  Administered 2019-08-28: 1 via TOPICAL

## 2019-08-28 MED ORDER — DIBUCAINE (PERIANAL) 1 % EX OINT: 1.0000 "application " | TOPICAL_OINTMENT | CUTANEOUS | Status: DC | PRN

## 2019-08-28 MED ORDER — ZOLPIDEM TARTRATE 5 MG PO TABS: 5.0000 mg | ORAL_TABLET | Freq: Every evening | ORAL | Status: DC | PRN

## 2019-08-28 MED ORDER — LEVOTHYROXINE SODIUM 75 MCG PO TABS
150.0000 ug | ORAL_TABLET | Freq: Every day | ORAL | Status: DC
  Administered 2019-08-28 – 2019-08-29 (×2): 150 ug via ORAL
  Filled 2019-08-28 (×2): qty 2

## 2019-08-28 MED ORDER — DIPHENHYDRAMINE HCL 25 MG PO CAPS: 25.0000 mg | ORAL_CAPSULE | Freq: Four times a day (QID) | ORAL | Status: DC | PRN

## 2019-08-28 MED ORDER — OXYTOCIN-SODIUM CHLORIDE 30-0.9 UT/500ML-% IV SOLN
2.5000 [IU]/h | INTRAVENOUS | Status: DC
  Administered 2019-08-28: 36 [IU]/h via INTRAVENOUS

## 2019-08-28 MED ORDER — ACETAMINOPHEN 325 MG PO TABS: 650.0000 mg | ORAL_TABLET | ORAL | Status: DC | PRN

## 2019-08-28 MED ORDER — BENZOCAINE-MENTHOL 20-0.5 % EX AERO
1.0000 "application " | INHALATION_SPRAY | CUTANEOUS | Status: DC | PRN
  Administered 2019-08-28: 1 via TOPICAL
  Filled 2019-08-28: qty 56

## 2019-08-28 MED ORDER — SENNOSIDES-DOCUSATE SODIUM 8.6-50 MG PO TABS
2.0000 | ORAL_TABLET | ORAL | Status: DC
  Administered 2019-08-28: 2 via ORAL
  Filled 2019-08-28: qty 2

## 2019-08-28 MED ORDER — SIMETHICONE 80 MG PO CHEW: 80.0000 mg | CHEWABLE_TABLET | ORAL | Status: DC | PRN

## 2019-08-28 MED ORDER — ONDANSETRON HCL 4 MG PO TABS: 4.0000 mg | ORAL_TABLET | ORAL | Status: DC | PRN

## 2019-08-28 MED ORDER — HYDROCHLOROTHIAZIDE 12.5 MG PO CAPS
12.5000 mg | ORAL_CAPSULE | Freq: Every day | ORAL | Status: DC
  Administered 2019-08-28 – 2019-08-29 (×2): 12.5 mg via ORAL
  Filled 2019-08-28 (×2): qty 1

## 2019-08-28 MED ORDER — WITCH HAZEL-GLYCERIN EX PADS: 1.0000 "application " | MEDICATED_PAD | CUTANEOUS | Status: DC | PRN

## 2019-08-28 MED ORDER — PRENATAL MULTIVITAMIN CH
1.0000 | ORAL_TABLET | Freq: Every day | ORAL | Status: DC
  Administered 2019-08-28 – 2019-08-29 (×2): 1 via ORAL
  Filled 2019-08-28 (×2): qty 1

## 2019-08-28 NOTE — Progress Notes (Signed)
S:  Feeling pressure and urge to push  O:  Pitocin at 12 milliunits VS: Blood pressure 119/67, pulse 86, temperature 98.5 F (36.9 C), temperature source Oral, resp. rate 16, height 5\' 7"  (1.702 m), weight 116.3 kg, unknown if currently breastfeeding.        FHR : baseline 125 bpm / variability moderate / accelerations +15x15 /  early decelerations        Toco: contractions every 2 minutes / moderate-strong        Cervix : Dilation: 6 Effacement (%): 100 Cervical Position: Posterior Station: -2 Presentation: Vertex Exam by:: [email or web address], CNM  Asynclitic ROT/ROP        Membranes: clear fluid  A: Active abor     FHR category 1    GBS neg    LGA  P: D/C Pitocin; okay for intermittent fetal monitoring protocol while in the shower. Recommend hands/knees in shower Anticipate NSVD    Carlean Jews, MSN, CNM [facility]

## 2019-08-28 NOTE — Lactation Note (Signed)
This note was copied from a baby's chart. Lactation Consultation Note  Patient Name: Debra Robinson TKPTW'S Date: 08/28/2019  mom is a G2P2 experienced breastfeeding mom.  Mom reports she breastfed her first baby without any difficulty.[name] and now 81 hours old. Mom reportss she was the first in her family to breastfeed.  Mom was breastfeeding on arrival but stopped as I continued to enter room and covered herself.  He was still cuing.  Asked if mom wanted me to observe a feeding.  Mom reports she has it.  Feels they don't need to be observed.  Urged to feed on cue and 8-12 or more times day.  Infant did not feed today for 5 hours.  Was cird at 12 pm.  Urged mom to unswaddle and try to wake him up definetly by 4 hours.  Explained that it is hard to get minimum of 8 in for a baby that doesn't wake to feed if you go 4 hours too often. Discussed hand expression and spoon feeding. Urged parents to watch [employer] hand expression video. Reviewed and left [facility] handout.  Urged parents to call lactation as needed Maternal Data    Feeding Feeding Type: Breast Fed  [facility] P S Score                   Interventions    Lactation Tools Discussed/Used     Consult Status      Debra Robinson 08/28/2019, 6:01 PM

## 2019-08-28 NOTE — Progress Notes (Addendum)
PPD # 1 S/P NSVD  Live born female  Birth Weight: 8 lb 11.2 oz (3946 g) APGAR: 9, 9  Newborn Delivery   Birth date/time: 08/28/2019 01:24:00 Delivery type: Vaginal, Spontaneous     Baby name: Debra Robinson Delivering provider: Carlean Jews C  Episiotomy:None   Lacerations:2nd degree   circumcision planned today  Feeding: breast  Pain control at delivery: Local   S:  Reports feeling very well.             Tolerating po/ No nausea or vomiting             Bleeding is decreased.             Pain controlled with acetaminophen and ibuprofen (OTC)             Up ad lib / ambulatory / voiding without difficulties   O:  A & O x 3, in no apparent distress              VS:  Vitals:   08/28/19 0245 08/28/19 0317 08/28/19 0435 08/28/19 0822  BP: 130/72 139/85 118/73 121/71  Pulse: 74 89 86 81  Resp:  20 18 16   Temp: 99.1 F (37.3 C) 98.3 F (36.8 C) 98.9 F (37.2 C) 98.4 F (36.9 C)  TempSrc: Oral Oral Oral Oral  SpO2:    99%  Weight:      Height:        LABS:  Recent Labs    08/27/19 1330 08/28/19 0605  WBC 9.5 19.4*  HGB 11.2* 10.5*  HCT 34.5* 31.6*  PLT 216 217    Blood type: --/--/O POS, O POS Performed at [facility], [address], [place], [place]  [phone])  Rubella:   immune  I&O: I/O last 3 completed shifts: In: 789.5 [I.V.:789.5] Out: 230 [Blood:230]          No intake/output data recorded.  Vaccines: TDaP UTD         Flu    UTD   Gen: AAO x 3, NAD  Abdomen: soft, non-tender, non-distended             Fundus: firm, non-tender, U-1  Perineum: repair intact, no edema  Lochia: small  Extremities: +2 pedal edema, no calf pain or tenderness    A/P: PPD # 1 29 y.o., 7/24   Principal Problem:   Postpartum care following vaginal delivery (6/25) Active Problems:   Hyperthyroidism without crisis  - stable on synthroid, continue current course, F/U outpatient   Encounter for induction of labor   SVD (spontaneous vaginal  delivery)   Second degree perineal laceration   Dependent edema, add HCTZ 12.5 mg daily x 5   Doing well - stable status  Routine post partum orders  Anticipate discharge tomorrow    11-13-2003, MSN, CNM 08/28/2019, 11:22 AM

## 2019-08-29 MED ORDER — ACETAMINOPHEN 325 MG PO TABS: 650.0000 mg | ORAL_TABLET | ORAL | Status: DC | PRN

## 2019-08-29 MED ORDER — IBUPROFEN 600 MG PO TABS: 600.0000 mg | ORAL_TABLET | Freq: Four times a day (QID) | ORAL | 0 refills | Status: DC

## 2019-08-29 MED ORDER — HYDROCHLOROTHIAZIDE 12.5 MG PO CAPS: 12.5000 mg | ORAL_CAPSULE | Freq: Every day | ORAL | 0 refills | Status: DC

## 2019-08-29 MED ORDER — BENZOCAINE-MENTHOL 20-0.5 % EX AERO: 1.0000 "application " | INHALATION_SPRAY | CUTANEOUS | Status: DC | PRN

## 2019-08-29 MED ORDER — COCONUT OIL OIL: 1.0000 "application " | TOPICAL_OIL | 0 refills | Status: DC | PRN

## 2019-08-29 NOTE — Discharge Summary (Signed)
OB Discharge Summary  Patient Name: Debra Robinson DOB: January 04, 1991 MRN: [id number]  Date of admission: 08/27/2019 Delivering provider: Lars Pinks C   Admitting diagnosis: Encounter for induction of labor [Z34.90] Intrauterine pregnancy: [redacted]w[redacted]d     Secondary diagnosis: Patient Active Problem List   Diagnosis Date Noted  . SVD (spontaneous vaginal delivery) 08/28/2019  . Postpartum care following vaginal delivery (6/25) 08/28/2019  . Second degree perineal laceration 08/28/2019  . Encounter for induction of labor 08/27/2019  . Hyperthyroidism 12/06/2017  . Hyperthyroidism without crisis 08/13/2014  . Thyroid mass 08/13/2014   Additional problems:none   Date of discharge: 08/29/2019   Discharge diagnosis: Principal Problem:   Postpartum care following vaginal delivery (6/25) Active Problems:   Hyperthyroidism without crisis   Encounter for induction of labor   SVD (spontaneous vaginal delivery)   Second degree perineal laceration                                                              Post partum procedures:none  Augmentation: AROM and Pitocin Pain control: Local  Laceration:2nd degree  Episiotomy:None  Complications: None  Hospital course:  Induction of Labor With Vaginal Delivery   29 y.o. yo [id number] at [redacted]w[redacted]d was admitted to the hospital 08/27/2019 for induction of labor.  Indication for induction: Favorable cervix at term.  Patient had an uncomplicated labor course as follows: Membrane Rupture Time/Date: 10:19 PM ,08/27/2019   Delivery Method:Vaginal, Spontaneous  Episiotomy: None  Lacerations:  2nd degree  Details of delivery can be found in separate delivery note.  Patient had a routine postpartum course. Patient is discharged home 08/29/19.  Newborn Data: Birth date:08/28/2019  Birth time:1:24 AM  Gender:Female  Living status:Living  Apgars:9 ,9  [id number] g   Physical exam  Vitals:   08/28/19 1641 08/28/19 2158 08/29/19 0432 08/29/19 0548  BP: 115/76  122/79 106/73 127/72  Pulse: 87 69 72   Resp: 17 18 18    Temp: 97.8 F (36.6 C)  98.2 F (36.8 C)   TempSrc: Oral  Oral   SpO2: 100%     Weight:      Height:       General: no distress Lochia: appropriate Uterine Fundus: firm Incision: N/A Perineum: repair intact, no edema DVT Evaluation: No cords or calf tenderness. Calf/Ankle edema is present Labs: Lab Results  Component Value Date   WBC 19.4 (H) 08/28/2019   HGB 10.5 (L) 08/28/2019   HCT 31.6 (L) 08/28/2019   MCV 88.8 08/28/2019   PLT 217 08/28/2019   CMP Latest Ref Rng & Units 12/07/2017  Glucose 70 - 99 mg/dL 111(H)  BUN 6 - 20 mg/dL 6  Creatinine 0.44 - 1.00 mg/dL 0.84  Sodium 135 - 145 mmol/L 140  Potassium 3.5 - 5.1 mmol/L 4.0  Chloride 98 - 111 mmol/L 103  CO2 22 - 32 mmol/L 26  Calcium 8.9 - 10.3 mg/dL 8.9  Total Protein 6.5 - 8.1 g/dL -  Total Bilirubin 0.3 - 1.2 mg/dL -  Alkaline Phos 38 - 126 U/L -  AST 15 - 41 U/L -  ALT 14 - 54 U/L -   Edinburgh Postnatal Depression Scale Screening Tool 08/28/2019  I have been able to laugh and see the funny side of things. 0  I have looked forward with  enjoyment to things. 0  I have blamed myself unnecessarily when things went wrong. 0  I have been anxious or worried for no good reason. 0  I have felt scared or panicky for no good reason. 1  Things have been getting on top of me. 0  I have been so unhappy that I have had difficulty sleeping. 0  I have felt sad or miserable. 0  I have been so unhappy that I have been crying. 1  The thought of harming myself has occurred to me. 0  Edinburgh Postnatal Depression Scale Total 2   Vaccines: TDaP UTD         Flu    UTD  Discharge instruction:  per After Visit Summary,  [facility] booklet and  "Understanding Mother & Baby Care" hospital booklet  After Visit Meds:  Allergies as of 08/29/2019      Reactions   Augmentin [amoxicillin-pot Clavulanate] Rash      Medication List    STOP taking these medications    etonogestrel-ethinyl estradiol 0.12-0.015 MG/24HR vaginal ring Commonly known as: NUVARING     TAKE these medications   acetaminophen 325 MG tablet Commonly known as: Tylenol Take 2 tablets (650 mg total) by mouth every 4 (four) hours as needed (for pain scale < 4).   benzocaine-Menthol 20-0.5 % Aero Commonly known as: DERMOPLAST Apply 1 application topically as needed for irritation (perineal discomfort).   calcium carbonate 500 MG chewable tablet Commonly known as: Tums Chew 2 tablets (400 mg of elemental calcium total) by mouth 3 (three) times daily.   coconut oil Oil Apply 1 application topically as needed.   hydrochlorothiazide 12.5 MG capsule Commonly known as: MICROZIDE Take 1 capsule (12.5 mg total) by mouth daily for 4 doses.   ibuprofen 600 MG tablet Commonly known as: ADVIL Take 1 tablet (600 mg total) by mouth every 6 (six) hours.   levothyroxine 88 MCG tablet Commonly known as: Synthroid Take 1 tablet (88 mcg total) by mouth daily before breakfast.   traMADol 50 MG tablet Commonly known as: ULTRAM Take 1-2 tablets (50-100 mg total) by mouth every 6 (six) hours as needed for moderate pain.            Discharge Care Instructions  (From admission, onward)         Start     Ordered   08/29/19 0000  Discharge wound care:       Comments: Sitz baths 2 times /day with warm water x 1 week. May add herbals: 1 ounce dried comfrey leaf* 1 ounce calendula flowers 1 ounce lavender flowers  Supplies can be found online at [employer] sources at [employer], [employer]  1/2 ounce dried uva ursi leaves 1/2 ounce witch hazel blossoms (if you can find them) 1/2 ounce dried sage leaf 1/2 cup sea salt Directions: Bring 2 quarts of water to a boil. Turn off heat, and place 1 ounce (approximately 1 large handful) of the above mixed herbs (not the salt) into the pot. Steep, covered, for 30 minutes.  Strain the liquid well with a fine mesh strainer, and  discard the herb material. Add 2 quarts of liquid to the tub, along with the 1/2 cup of salt. This medicinal liquid can also be made into compresses and peri-rinses.   08/29/19 0737          Diet: routine diet  Activity: Advance as tolerated. Pelvic rest for 6 weeks.   Postpartum contraception: Not Discussed  Newborn Data: Live born female  Birth Weight: 8 lb 11.2 oz (3946 g) APGAR: 9, 9  Newborn Delivery   Birth date/time: 08/28/2019 01:24:00 Delivery type: Vaginal, Spontaneous      named Samuel Bouche Baby Feeding: Breast Disposition:home with mother   Delivery Report:  Review the Delivery Report for details.    Follow up:  Follow-up Information    Karena Addison, CNM. Schedule an appointment as soon as possible for a visit in 2 week(s).   Specialty: Obstetrics and Gynecology Contact information: [address] [place] [address] [phone]                 Signed: Cipriano Mile, MSN 08/29/2019, 7:38 AM

## 2019-08-29 NOTE — Plan of Care (Signed)
  Problem: Education: Goal: Knowledge of condition will improve Outcome: Completed/Met   Problem: Activity: Goal: Will verbalize the importance of balancing activity with adequate rest periods Outcome: Completed/Met Goal: Ability to tolerate increased activity will improve Outcome: Completed/Met   Problem: Life Cycle: Goal: Chance of risk for complications during the postpartum period will decrease Outcome: Completed/Met   Problem: Role Relationship: Goal: Ability to demonstrate positive interaction with newborn will improve Outcome: Completed/Met   Problem: Skin Integrity: Goal: Demonstration of wound healing without infection will improve Outcome: Completed/Met   

## 2019-09-03 ENCOUNTER — Inpatient Hospital Stay (HOSPITAL_COMMUNITY): Admission: RE | Admit: 2019-09-03 | Payer: BC Managed Care – PPO | Source: Home / Self Care

## 2019-09-11 DIAGNOSIS — Z0130 Encounter for examination of blood pressure without abnormal findings: Secondary | ICD-10-CM | POA: Diagnosis not present

## 2019-10-06 DIAGNOSIS — E890 Postprocedural hypothyroidism: Secondary | ICD-10-CM | POA: Diagnosis not present

## 2019-11-26 DIAGNOSIS — E215 Disorder of parathyroid gland, unspecified: Secondary | ICD-10-CM | POA: Diagnosis not present

## 2019-11-26 DIAGNOSIS — E890 Postprocedural hypothyroidism: Secondary | ICD-10-CM | POA: Diagnosis not present

## 2019-11-26 DIAGNOSIS — R42 Dizziness and giddiness: Secondary | ICD-10-CM | POA: Diagnosis not present

## 2020-07-24 IMAGING — CR DG CHEST 2V
2 series · 2 of 2 positions shown · non-contrast
Comparison: None.

CLINICAL DATA: Preoperative evaluation for upcoming thyroidectomy

EXAM:
CHEST - 2 VIEW

[w chest pa]
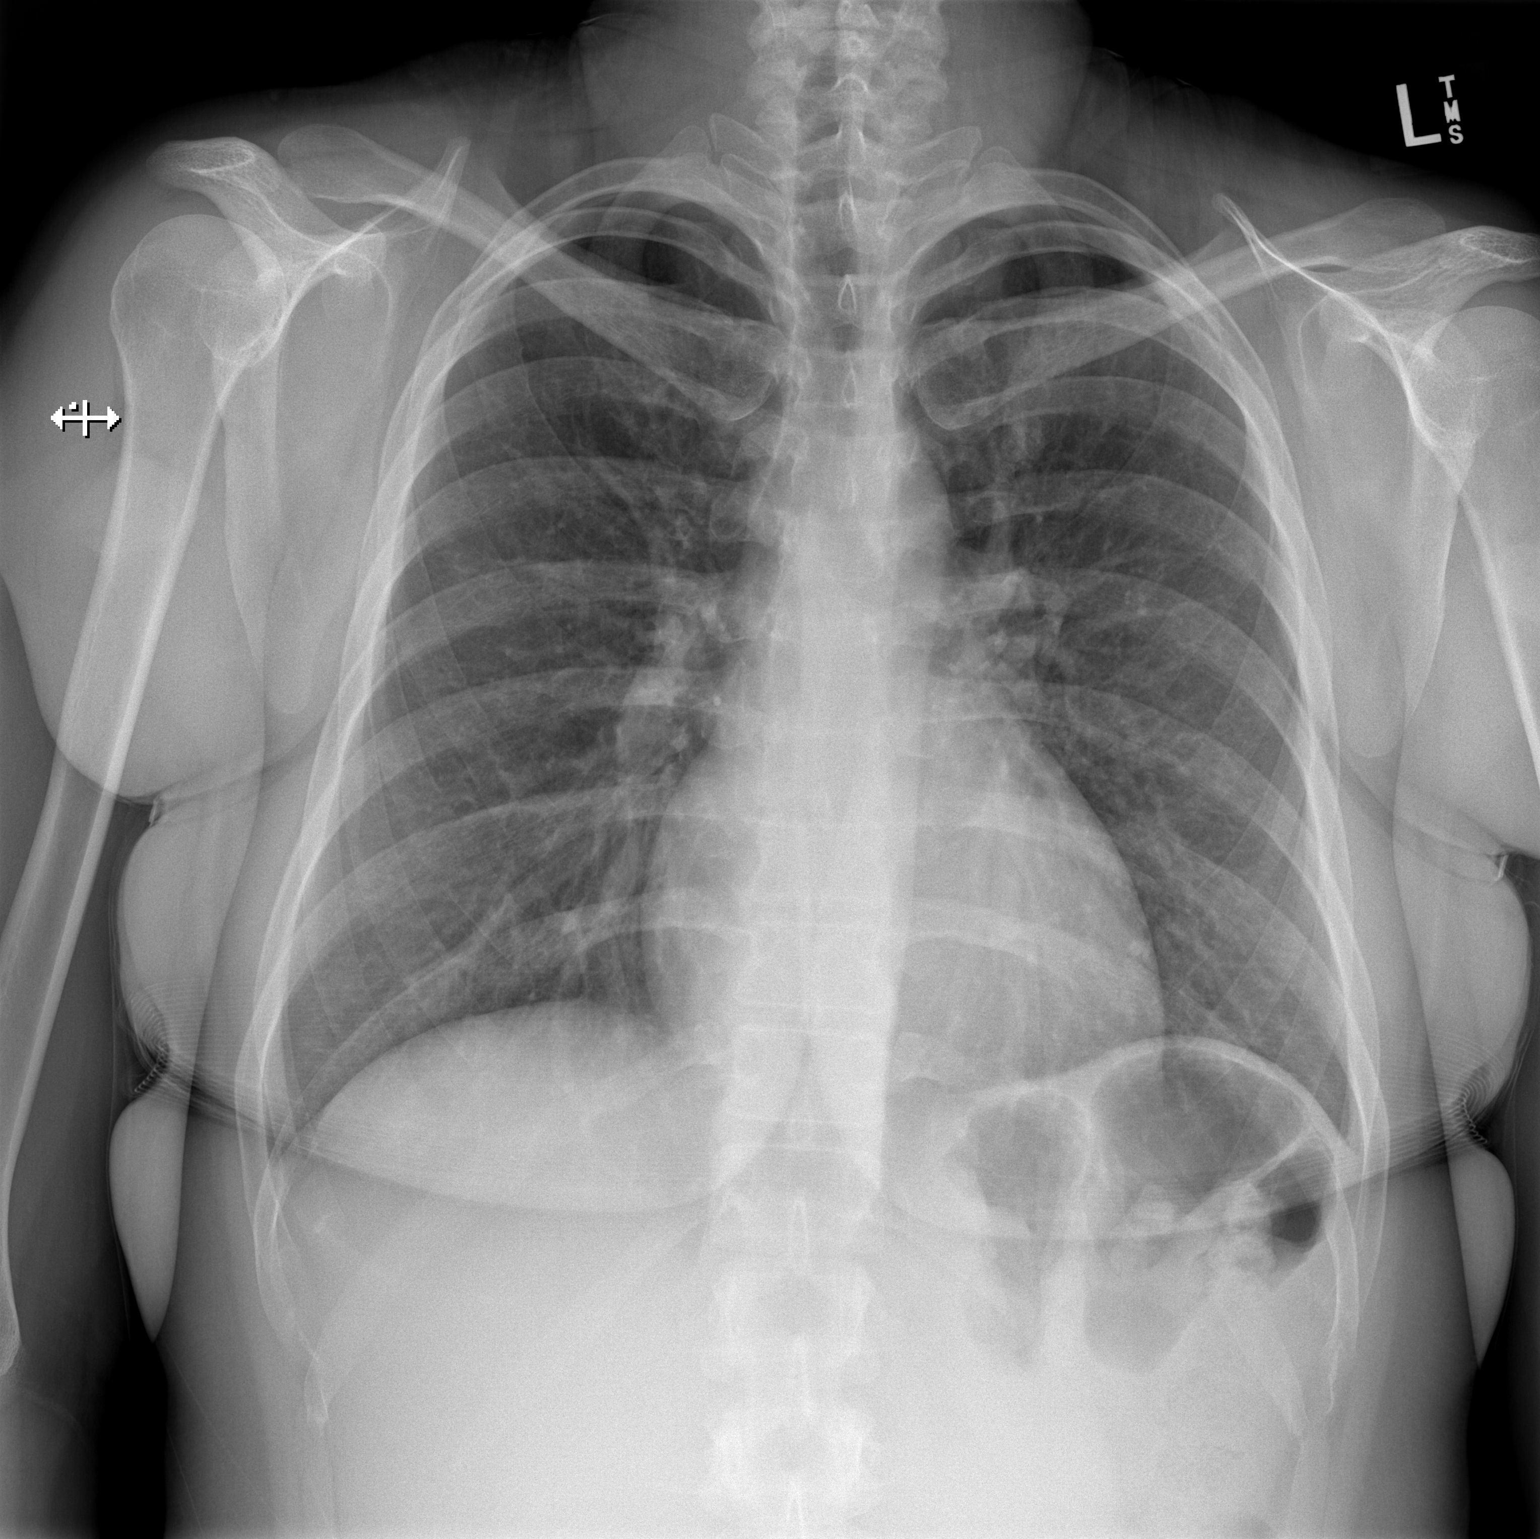

[w chest lat]
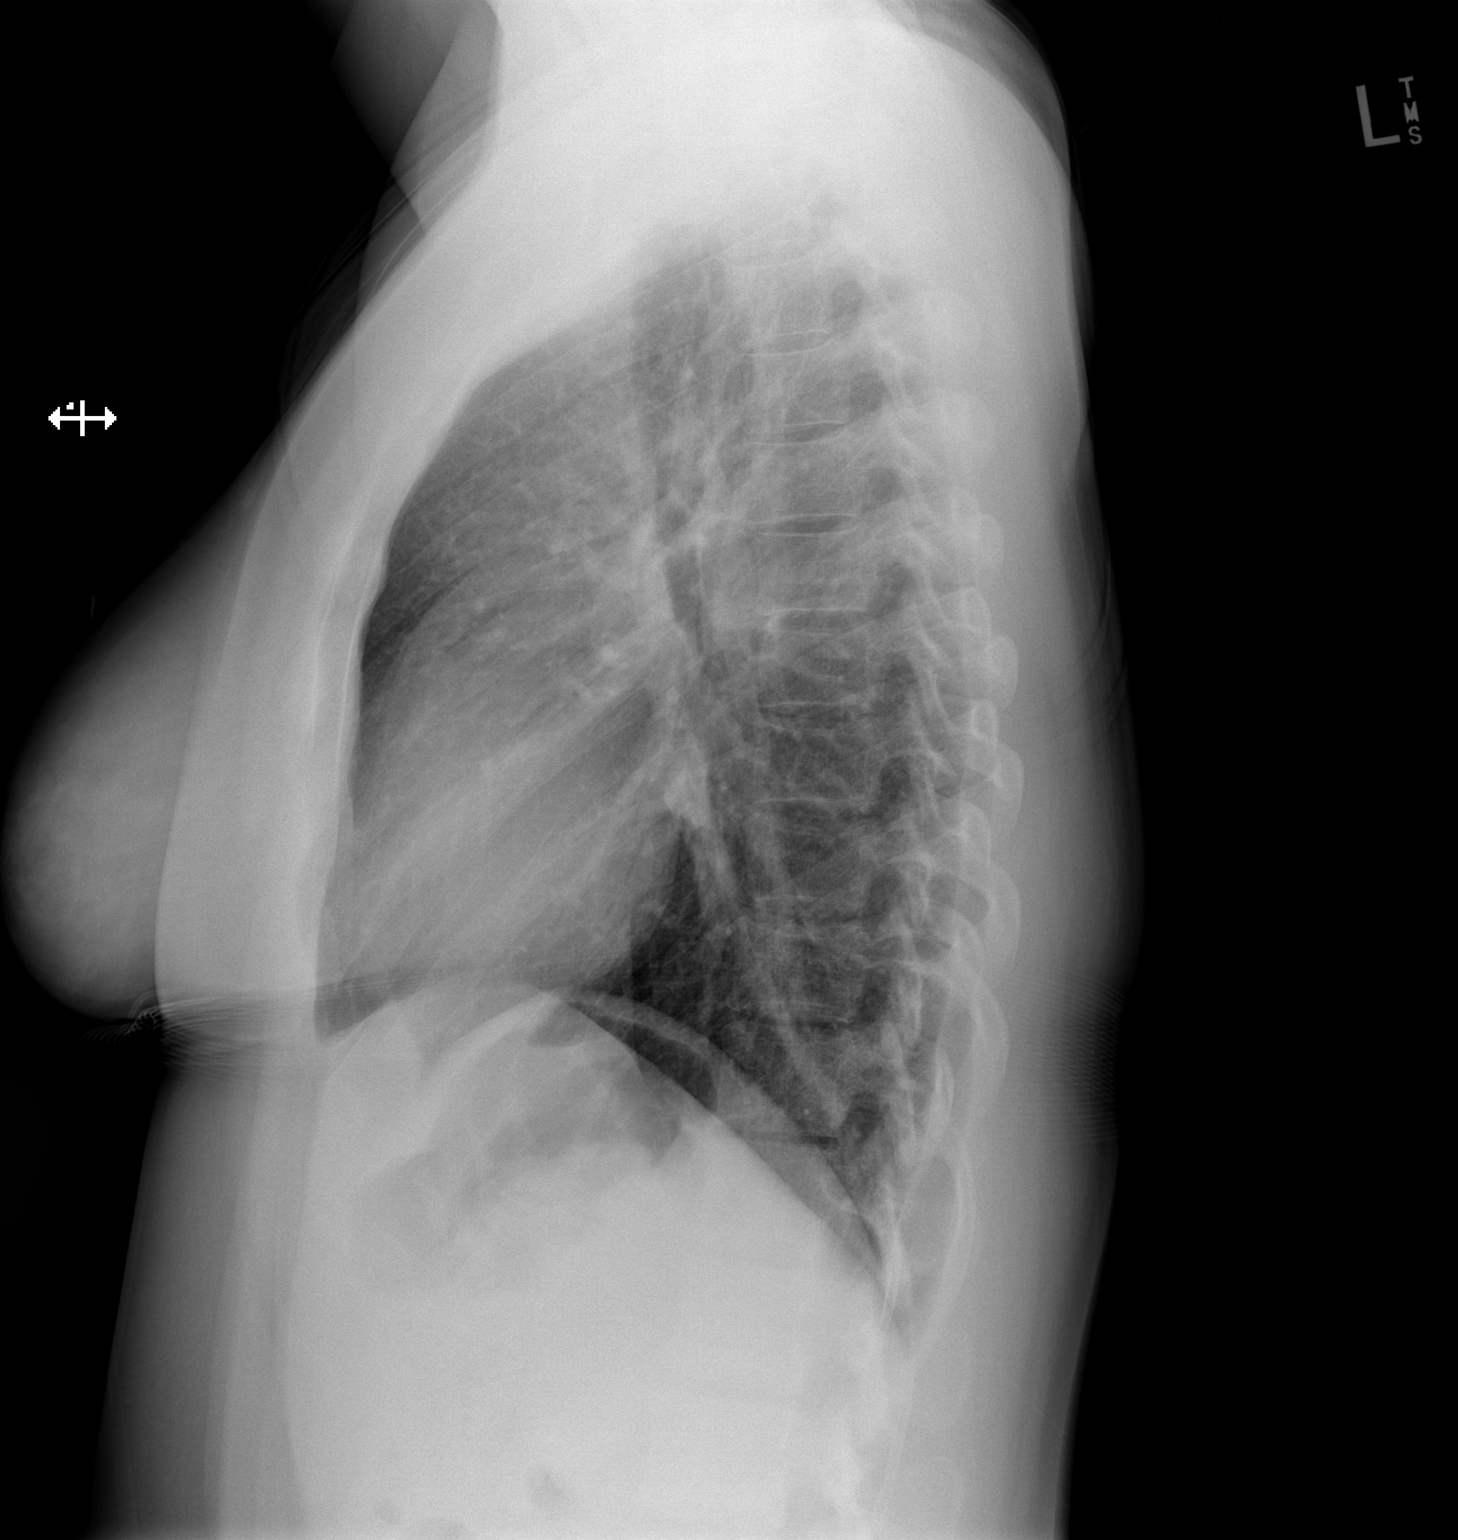

[2 of 2 positions shown; findings below may reference images not displayed]

FINDINGS: The heart size and mediastinal contours are within normal limits.
Both lungs are clear. The visualized skeletal structures are
unremarkable.
IMPRESSION: No active cardiopulmonary disease.

## 2020-10-19 DIAGNOSIS — Z113 Encounter for screening for infections with a predominantly sexual mode of transmission: Secondary | ICD-10-CM | POA: Diagnosis not present

## 2020-10-19 DIAGNOSIS — Z13220 Encounter for screening for lipoid disorders: Secondary | ICD-10-CM | POA: Diagnosis not present

## 2020-10-19 DIAGNOSIS — Z01419 Encounter for gynecological examination (general) (routine) without abnormal findings: Secondary | ICD-10-CM | POA: Diagnosis not present

## 2020-10-19 DIAGNOSIS — Z0000 Encounter for general adult medical examination without abnormal findings: Secondary | ICD-10-CM | POA: Diagnosis not present

## 2020-10-19 DIAGNOSIS — Z6832 Body mass index (BMI) 32.0-32.9, adult: Secondary | ICD-10-CM | POA: Diagnosis not present

## 2020-10-19 DIAGNOSIS — Z124 Encounter for screening for malignant neoplasm of cervix: Secondary | ICD-10-CM | POA: Diagnosis not present

## 2020-10-19 DIAGNOSIS — Z1329 Encounter for screening for other suspected endocrine disorder: Secondary | ICD-10-CM | POA: Diagnosis not present

## 2020-10-19 DIAGNOSIS — Z131 Encounter for screening for diabetes mellitus: Secondary | ICD-10-CM | POA: Diagnosis not present

## 2021-01-16 DIAGNOSIS — E669 Obesity, unspecified: Secondary | ICD-10-CM | POA: Diagnosis not present

## 2021-01-16 DIAGNOSIS — E890 Postprocedural hypothyroidism: Secondary | ICD-10-CM | POA: Diagnosis not present

## 2021-01-16 DIAGNOSIS — E215 Disorder of parathyroid gland, unspecified: Secondary | ICD-10-CM | POA: Diagnosis not present

## 2021-02-13 DIAGNOSIS — Z03818 Encounter for observation for suspected exposure to other biological agents ruled out: Secondary | ICD-10-CM | POA: Diagnosis not present

## 2021-02-13 DIAGNOSIS — J209 Acute bronchitis, unspecified: Secondary | ICD-10-CM | POA: Diagnosis not present

## 2021-07-06 DIAGNOSIS — Z3200 Encounter for pregnancy test, result unknown: Secondary | ICD-10-CM | POA: Diagnosis not present

## 2021-07-06 DIAGNOSIS — Z3689 Encounter for other specified antenatal screening: Secondary | ICD-10-CM | POA: Diagnosis not present

## 2021-07-06 DIAGNOSIS — Z8759 Personal history of other complications of pregnancy, childbirth and the puerperium: Secondary | ICD-10-CM | POA: Diagnosis not present

## 2021-07-10 DIAGNOSIS — Z8759 Personal history of other complications of pregnancy, childbirth and the puerperium: Secondary | ICD-10-CM | POA: Diagnosis not present

## 2021-07-13 DIAGNOSIS — Z8759 Personal history of other complications of pregnancy, childbirth and the puerperium: Secondary | ICD-10-CM | POA: Diagnosis not present

## 2021-07-19 DIAGNOSIS — Z3201 Encounter for pregnancy test, result positive: Secondary | ICD-10-CM | POA: Diagnosis not present

## 2021-08-02 DIAGNOSIS — E039 Hypothyroidism, unspecified: Secondary | ICD-10-CM | POA: Diagnosis not present

## 2021-08-02 DIAGNOSIS — Z3201 Encounter for pregnancy test, result positive: Secondary | ICD-10-CM | POA: Diagnosis not present

## 2021-08-21 DIAGNOSIS — Z3689 Encounter for other specified antenatal screening: Secondary | ICD-10-CM | POA: Diagnosis not present

## 2021-08-21 DIAGNOSIS — Z3A10 10 weeks gestation of pregnancy: Secondary | ICD-10-CM | POA: Diagnosis not present

## 2021-08-21 DIAGNOSIS — O99281 Endocrine, nutritional and metabolic diseases complicating pregnancy, first trimester: Secondary | ICD-10-CM | POA: Diagnosis not present

## 2021-08-21 LAB — OB RESULTS CONSOLE RUBELLA ANTIBODY, IGM: Rubella: IMMUNE

## 2021-08-21 LAB — OB RESULTS CONSOLE HIV ANTIBODY (ROUTINE TESTING): HIV: NONREACTIVE

## 2021-08-21 LAB — OB RESULTS CONSOLE HEPATITIS B SURFACE ANTIGEN: Hepatitis B Surface Ag: NEGATIVE

## 2021-08-21 LAB — OB RESULTS CONSOLE GC/CHLAMYDIA
Chlamydia: NEGATIVE
Neisseria Gonorrhea: NEGATIVE

## 2021-08-21 LAB — OB RESULTS CONSOLE RPR: RPR: NONREACTIVE

## 2021-09-04 DIAGNOSIS — Z3682 Encounter for antenatal screening for nuchal translucency: Secondary | ICD-10-CM | POA: Diagnosis not present

## 2021-09-04 DIAGNOSIS — O99281 Endocrine, nutritional and metabolic diseases complicating pregnancy, first trimester: Secondary | ICD-10-CM | POA: Diagnosis not present

## 2021-10-19 DIAGNOSIS — O99281 Endocrine, nutritional and metabolic diseases complicating pregnancy, first trimester: Secondary | ICD-10-CM | POA: Diagnosis not present

## 2021-10-19 DIAGNOSIS — Z3A19 19 weeks gestation of pregnancy: Secondary | ICD-10-CM | POA: Diagnosis not present

## 2021-10-19 DIAGNOSIS — Z363 Encounter for antenatal screening for malformations: Secondary | ICD-10-CM | POA: Diagnosis not present

## 2021-11-15 DIAGNOSIS — Z3A22 22 weeks gestation of pregnancy: Secondary | ICD-10-CM | POA: Diagnosis not present

## 2021-11-15 DIAGNOSIS — O99281 Endocrine, nutritional and metabolic diseases complicating pregnancy, first trimester: Secondary | ICD-10-CM | POA: Diagnosis not present

## 2021-12-20 DIAGNOSIS — O3662X0 Maternal care for excessive fetal growth, second trimester, not applicable or unspecified: Secondary | ICD-10-CM | POA: Diagnosis not present

## 2021-12-20 DIAGNOSIS — O99282 Endocrine, nutritional and metabolic diseases complicating pregnancy, second trimester: Secondary | ICD-10-CM | POA: Diagnosis not present

## 2021-12-20 DIAGNOSIS — Z3689 Encounter for other specified antenatal screening: Secondary | ICD-10-CM | POA: Diagnosis not present

## 2021-12-20 DIAGNOSIS — Z3A27 27 weeks gestation of pregnancy: Secondary | ICD-10-CM | POA: Diagnosis not present

## 2021-12-20 DIAGNOSIS — O99281 Endocrine, nutritional and metabolic diseases complicating pregnancy, first trimester: Secondary | ICD-10-CM | POA: Diagnosis not present

## 2022-01-01 DIAGNOSIS — Z3689 Encounter for other specified antenatal screening: Secondary | ICD-10-CM | POA: Diagnosis not present

## 2022-01-01 DIAGNOSIS — Z23 Encounter for immunization: Secondary | ICD-10-CM | POA: Diagnosis not present

## 2022-01-24 DIAGNOSIS — Z3A31 31 weeks gestation of pregnancy: Secondary | ICD-10-CM | POA: Diagnosis not present

## 2022-01-24 DIAGNOSIS — O99281 Endocrine, nutritional and metabolic diseases complicating pregnancy, first trimester: Secondary | ICD-10-CM | POA: Diagnosis not present

## 2022-01-24 DIAGNOSIS — O3663X1 Maternal care for excessive fetal growth, third trimester, fetus 1: Secondary | ICD-10-CM | POA: Diagnosis not present

## 2022-02-05 DIAGNOSIS — Z3A34 34 weeks gestation of pregnancy: Secondary | ICD-10-CM | POA: Diagnosis not present

## 2022-02-05 DIAGNOSIS — O26843 Uterine size-date discrepancy, third trimester: Secondary | ICD-10-CM | POA: Diagnosis not present

## 2022-02-09 DIAGNOSIS — J029 Acute pharyngitis, unspecified: Secondary | ICD-10-CM | POA: Diagnosis not present

## 2022-02-09 DIAGNOSIS — R051 Acute cough: Secondary | ICD-10-CM | POA: Diagnosis not present

## 2022-02-09 DIAGNOSIS — Z03818 Encounter for observation for suspected exposure to other biological agents ruled out: Secondary | ICD-10-CM | POA: Diagnosis not present

## 2022-02-09 DIAGNOSIS — O99513 Diseases of the respiratory system complicating pregnancy, third trimester: Secondary | ICD-10-CM | POA: Diagnosis not present

## 2022-02-12 DIAGNOSIS — R070 Pain in throat: Secondary | ICD-10-CM | POA: Diagnosis not present

## 2022-02-12 DIAGNOSIS — J0100 Acute maxillary sinusitis, unspecified: Secondary | ICD-10-CM | POA: Diagnosis not present

## 2022-02-22 DIAGNOSIS — O3663X0 Maternal care for excessive fetal growth, third trimester, not applicable or unspecified: Secondary | ICD-10-CM | POA: Diagnosis not present

## 2022-02-22 DIAGNOSIS — Z3A37 37 weeks gestation of pregnancy: Secondary | ICD-10-CM | POA: Diagnosis not present

## 2022-02-22 DIAGNOSIS — Z3685 Encounter for antenatal screening for Streptococcus B: Secondary | ICD-10-CM | POA: Diagnosis not present

## 2022-02-22 LAB — OB RESULTS CONSOLE GBS: GBS: POSITIVE

## 2022-03-05 NOTE — L&D Delivery Note (Signed)
   Delivery Note:   [id number] at [redacted]w[redacted]d  Admitting diagnosis: Pregnant [Z34.90] Risks: LGA, hypothyroidism, GBS positive Onset of labor: 03/10/2022 at 0343 IOL/Augmentation: AROM and Pitocin ROM: 03/10/2022 at 0343, clear fluid  Complete dilation at 03/10/2022  0620 Onset of pushing at 0620 FHR second stage Cat I  Analgesia/Anesthesia intrapartum:None  Pushing in side lying position with CNM and L&D staff support. Husband, [name], present for birth and supportive.  Delivery of a Live born female  Birth Weight:   APGAR: 30, 9   Newborn Delivery   Birth date/time: 03/10/2022 06:21:00 Delivery type: Vaginal, Spontaneous     in cephalic presentation, position OA to ROA.  APGAR:1 min-9 , 5 min-9   Nuchal Cord: No ; true knot Cord double clamped after cessation of pulsation, cut by [facility].  Collection of cord blood for typing completed. Arterial cord blood sample-No    Placenta delivered-Spontaneous  with 3 vessels . Uterotonics: Pitocin Placenta to L&D Uterine tone firm  Bleeding scant  1st degree  laceration identified. Hemostatic and patient declined repair.  Episiotomy:None  Local analgesia: N/A  Repair: None Est. Blood Loss (VQ):25.95   Complications: None  Mom to postpartum. Baby Debra Robinson to Couplet care / Skin to Skin.  Delivery Report:   Review the Delivery Report for details.    Suzan Nailer, CNM, MSN 03/10/2022, 6:47 AM

## 2022-03-06 ENCOUNTER — Encounter (HOSPITAL_COMMUNITY): Payer: Self-pay | Admitting: *Deleted

## 2022-03-06 ENCOUNTER — Telehealth (HOSPITAL_COMMUNITY): Payer: Self-pay | Admitting: *Deleted

## 2022-03-06 NOTE — Telephone Encounter (Signed)
Preadmission screen  

## 2022-03-09 ENCOUNTER — Inpatient Hospital Stay (HOSPITAL_COMMUNITY): Payer: BC Managed Care – PPO

## 2022-03-09 ENCOUNTER — Inpatient Hospital Stay (HOSPITAL_COMMUNITY)
Admission: RE | Admit: 2022-03-09 | Discharge: 2022-03-11 | DRG: 807 | Disposition: A | Discharge status: Home / Self Care | Payer: BC Managed Care – PPO | Attending: Obstetrics and Gynecology | Admitting: Obstetrics and Gynecology

## 2022-03-09 DIAGNOSIS — E890 Postprocedural hypothyroidism: Secondary | ICD-10-CM | POA: Diagnosis not present

## 2022-03-09 DIAGNOSIS — Z881 Allergy status to other antibiotic agents status: Secondary | ICD-10-CM | POA: Diagnosis not present

## 2022-03-09 DIAGNOSIS — O358XX0 Maternal care for other (suspected) fetal abnormality and damage, not applicable or unspecified: Secondary | ICD-10-CM | POA: Diagnosis present

## 2022-03-09 DIAGNOSIS — Z3A39 39 weeks gestation of pregnancy: Secondary | ICD-10-CM

## 2022-03-09 DIAGNOSIS — O3663X0 Maternal care for excessive fetal growth, third trimester, not applicable or unspecified: Secondary | ICD-10-CM | POA: Diagnosis not present

## 2022-03-09 DIAGNOSIS — O99284 Endocrine, nutritional and metabolic diseases complicating childbirth: Secondary | ICD-10-CM | POA: Diagnosis not present

## 2022-03-09 DIAGNOSIS — Z880 Allergy status to penicillin: Secondary | ICD-10-CM

## 2022-03-09 DIAGNOSIS — Z412 Encounter for routine and ritual male circumcision: Secondary | ICD-10-CM | POA: Diagnosis not present

## 2022-03-09 DIAGNOSIS — E0590 Thyrotoxicosis, unspecified without thyrotoxic crisis or storm: Secondary | ICD-10-CM | POA: Diagnosis present

## 2022-03-09 DIAGNOSIS — Z3490 Encounter for supervision of normal pregnancy, unspecified, unspecified trimester: Secondary | ICD-10-CM

## 2022-03-09 DIAGNOSIS — O9902 Anemia complicating childbirth: Secondary | ICD-10-CM | POA: Diagnosis not present

## 2022-03-09 DIAGNOSIS — B951 Streptococcus, group B, as the cause of diseases classified elsewhere: Secondary | ICD-10-CM | POA: Diagnosis present

## 2022-03-09 DIAGNOSIS — O99824 Streptococcus B carrier state complicating childbirth: Secondary | ICD-10-CM | POA: Diagnosis not present

## 2022-03-09 DIAGNOSIS — E039 Hypothyroidism, unspecified: Secondary | ICD-10-CM | POA: Diagnosis present

## 2022-03-09 LAB — CBC
HCT: 32.6 % — ABNORMAL LOW (ref 36.0–46.0)
Hemoglobin: 11.6 g/dL — ABNORMAL LOW (ref 12.0–15.0)
MCH: 30.3 pg (ref 26.0–34.0)
MCHC: 35.6 g/dL (ref 30.0–36.0)
MCV: 85.1 fL (ref 80.0–100.0)
Platelets: 241 10*3/uL (ref 150–400)
RBC: 3.83 MIL/uL — ABNORMAL LOW (ref 3.87–5.11)
RDW: 12.7 % (ref 11.5–15.5)
WBC: 8 10*3/uL (ref 4.0–10.5)
nRBC: 0 % (ref 0.0–0.2)

## 2022-03-09 LAB — TYPE AND SCREEN
ABO/RH(D): O POS
Antibody Screen: NEGATIVE

## 2022-03-09 MED ORDER — OXYTOCIN BOLUS FROM INFUSION
333.0000 mL | Freq: Once | INTRAVENOUS | Status: AC
  Administered 2022-03-10: 600 mL via INTRAVENOUS

## 2022-03-09 MED ORDER — OXYCODONE-ACETAMINOPHEN 5-325 MG PO TABS: 2.0000 | ORAL_TABLET | ORAL | Status: DC | PRN

## 2022-03-09 MED ORDER — ACETAMINOPHEN 325 MG PO TABS: 650.0000 mg | ORAL_TABLET | ORAL | Status: DC | PRN

## 2022-03-09 MED ORDER — LACTATED RINGERS IV SOLN: 500.0000 mL | INTRAVENOUS | Status: DC | PRN

## 2022-03-09 MED ORDER — CEFAZOLIN SODIUM-DEXTROSE 1-4 GM/50ML-% IV SOLN: 1.0000 g | Freq: Three times a day (TID) | INTRAVENOUS | Status: DC

## 2022-03-09 MED ORDER — CEFAZOLIN SODIUM-DEXTROSE 2-4 GM/100ML-% IV SOLN
2.0000 g | Freq: Once | INTRAVENOUS | Status: AC
  Administered 2022-03-09: 2 g via INTRAVENOUS
  Filled 2022-03-09: qty 100

## 2022-03-09 MED ORDER — LACTATED RINGERS IV SOLN: INTRAVENOUS | Status: DC

## 2022-03-09 MED ORDER — TERBUTALINE SULFATE 1 MG/ML IJ SOLN: 0.2500 mg | Freq: Once | INTRAMUSCULAR | Status: DC | PRN

## 2022-03-09 MED ORDER — SOD CITRATE-CITRIC ACID 500-334 MG/5ML PO SOLN: 30.0000 mL | ORAL | Status: DC | PRN

## 2022-03-09 MED ORDER — ONDANSETRON HCL 4 MG/2ML IJ SOLN: 4.0000 mg | Freq: Four times a day (QID) | INTRAMUSCULAR | Status: DC | PRN

## 2022-03-09 MED ORDER — CEFAZOLIN SODIUM-DEXTROSE 1-4 GM/50ML-% IV SOLN
1.0000 g | Freq: Three times a day (TID) | INTRAVENOUS | Status: DC
  Administered 2022-03-10: 1 g via INTRAVENOUS
  Filled 2022-03-09 (×2): qty 50

## 2022-03-09 MED ORDER — OXYCODONE-ACETAMINOPHEN 5-325 MG PO TABS: 1.0000 | ORAL_TABLET | ORAL | Status: DC | PRN

## 2022-03-09 MED ORDER — OXYTOCIN-SODIUM CHLORIDE 30-0.9 UT/500ML-% IV SOLN
1.0000 m[IU]/min | INTRAVENOUS | Status: DC
  Administered 2022-03-09: 2 m[IU]/min via INTRAVENOUS

## 2022-03-09 MED ORDER — LIDOCAINE HCL (PF) 1 % IJ SOLN: 30.0000 mL | INTRAMUSCULAR | Status: DC | PRN

## 2022-03-09 MED ORDER — OXYTOCIN-SODIUM CHLORIDE 30-0.9 UT/500ML-% IV SOLN
2.5000 [IU]/h | INTRAVENOUS | Status: DC
  Filled 2022-03-09: qty 500

## 2022-03-09 NOTE — H&P (Signed)
OB ADMISSION/ HISTORY & PHYSICAL:  Admission Date: 03/09/2022  7:22 PM  Admit Diagnosis: Pregnant [Z34.90]    Debra Robinson is a 32 y.o. female [phone] at [redacted]w[redacted]d presenting for IOL for LGA. EFW at 37 weeks 96%. Denies contractions, leaking of fluid, or vaginal bleeding. Endorses + fetal movement. Husband, Debra Robinson, present and supportive. Eagerly anticipating a baby boy "[name]".   Prenatal History: [id number]   EDC: 03/15/2022 Prenatal care at [facility] since 10 weeks  Primary: M. Sigmon, CNM  Prenatal course complicated by: LGA, EFW 96% at 37 weeks, pelvis proven to 8lb 11oz Hypothyroidism, s/p thyroidectomy, on levothyroxine 136mcg, TFTs WNL Bilateral fetal pyelectasis, left 9.51mm and right 7.76mm at 37 weeks, f/u with peds PP GBS positive, allergy to Augmentin  Prenatal Labs: ABO, Rh:   O POS Antibody: PENDING (01/05 2015) Rubella: Immune (06/19 0000)  RPR: Nonreactive (06/19 0000)  HBsAg: Negative (06/19 0000)  HIV: Non-reactive (06/19 0000)  GBS: Positive/-- (12/21 0000)  1 hr Glucola : 102 Genetic Screening: WNL NT/Ultrascreen, negative AFP-1 Ultrasound: normal XY anatomy, posterior placenta, EFW 96% at 37 weeks    Maternal Diabetes: No Genetic Screening: Normal Maternal Ultrasounds/Referrals: Normal Fetal Ultrasounds or other Referrals:  None Maternal Substance Abuse:  No Significant Maternal Medications:  Meds include: Syntroid Significant Maternal Lab Results:  Group B Strep positive Other Comments:  None  Medical / Surgical History : Past medical history:  Past Medical History:  Diagnosis Date   Asthma    childhood,does not take any meds or use inhaler   Family history of adverse reaction to anesthesia    mother-nausea/vomiting   Headache    Hx of varicella    Hyperthyroidism    diagnosed in May 2016   Hypothyroidism    PONV (postoperative nausea and vomiting)    Postpartum care following vaginal delivery (6/9) 08/12/2014   Thyroid nodule    seeing Dr  Forde Dandy, bx 04/02/13    Past surgical history:  Past Surgical History:  Procedure Laterality Date   THYROIDECTOMY N/A 12/06/2017   Procedure: TOTAL THYROIDECTOMY;  Surgeon: Armandina Gemma, MD;  Location: [place];  Service: General;  Laterality: N/A;   WISDOM TOOTH EXTRACTION      Family History:  Family History  Problem Relation Age of Onset   Thyroid disease Mother    Breast cancer Mother    Diabetes Maternal Grandmother    Hypertension Maternal Grandmother    Thyroid disease Maternal Grandmother    Thyroid disease Paternal Grandmother     Social History:  reports that she has never smoked. She has never used smokeless tobacco. She reports that she does not drink alcohol and does not use drugs.  Allergies: Augmentin [amoxicillin-pot clavulanate]   Current Medications at time of admission:  Medications Prior to Admission  Medication Sig Dispense Refill Last Dose   acetaminophen (TYLENOL) 325 MG tablet Take 2 tablets (650 mg total) by mouth every 4 (four) hours as needed (for pain scale < 4).      benzocaine-Menthol (DERMOPLAST) 20-0.5 % AERO Apply 1 application topically as needed for irritation (perineal discomfort).      calcium carbonate (TUMS) 500 MG chewable tablet Chew 2 tablets (400 mg of elemental calcium total) by mouth 3 (three) times daily. 90 tablet 1    coconut oil OIL Apply 1 application topically as needed.  0    hydrochlorothiazide (MICROZIDE) 12.5 MG capsule Take 1 capsule (12.5 mg total) by mouth daily for 4 doses. 4 capsule 0  ibuprofen (ADVIL) 600 MG tablet Take 1 tablet (600 mg total) by mouth every 6 (six) hours. 30 tablet 0    levothyroxine (SYNTHROID) 88 MCG tablet Take 1 tablet (88 mcg total) by mouth daily before breakfast. 30 tablet 3    traMADol (ULTRAM) 50 MG tablet Take 1-2 tablets (50-100 mg total) by mouth every 6 (six) hours as needed for moderate pain. 20 tablet 0     Review of Systems: Review of Systems  All other systems reviewed and are  negative.  Physical Exam: Vital signs and nursing notes reviewed.  Patient Vitals for the past 24 hrs:  BP Temp Temp src Pulse Resp SpO2 Height Weight  03/09/22 1959 (!) 140/77 97.9 F (36.6 C) Oral 82 18 100 % 5\' 7"  (1.702 m) 116.3 kg    General: AAO x 3, NAD Heart: RRR Lungs:CTAB Abdomen: Gravid, NT Extremities: no edema SVE: Dilation: 3 Effacement (%): 70 Station: -2 Presentation: Vertex   FHR: 120BPM, moderate variability, + accels, no decels TOCO: Contractions irregular  Labs:   Recent Labs    03/09/22 2030  WBC 8.0  HGB 11.6*  HCT 32.6*  PLT 241   Assessment/Plan: 32 y.o. [id number] at [redacted]w[redacted]d, IOL for LGA 96% at 37 weeks Hypothyroidism, s/p thyroidectomy, on levothyroxine 119mcg GBS positive  Fetal wellbeing - FHT category 1 EFW LGA 8-9lbs  Labor: Plan Pitocin 2x2 and AROM after 4 hours of Ancef  GBS positive Rubella immune Rh positive  Pain control: desires unmedicated labor, hydrotherapy in active labor, delivery on bed for LGA, waterbirth class complete Analgesia/anesthesia PRN  Anticipated MOD: NSVB  Plans to breastfeed, plans inpatient circumcision. POC discussed with patient and support team, all questions answered.  Dr. Lanny Cramp notified of admission/plan of care.  Suzan Nailer CNM, MSN 03/09/2022, 9:19 PM

## 2022-03-10 DIAGNOSIS — B951 Streptococcus, group B, as the cause of diseases classified elsewhere: Secondary | ICD-10-CM | POA: Diagnosis present

## 2022-03-10 LAB — RPR: RPR Ser Ql: NONREACTIVE

## 2022-03-10 MED ORDER — DIBUCAINE (PERIANAL) 1 % EX OINT: 1.0000 | TOPICAL_OINTMENT | CUTANEOUS | Status: DC | PRN

## 2022-03-10 MED ORDER — IBUPROFEN 600 MG PO TABS
600.0000 mg | ORAL_TABLET | Freq: Four times a day (QID) | ORAL | Status: DC
  Administered 2022-03-10 – 2022-03-11 (×4): 600 mg via ORAL
  Filled 2022-03-10 (×4): qty 1

## 2022-03-10 MED ORDER — WITCH HAZEL-GLYCERIN EX PADS: 1.0000 | MEDICATED_PAD | CUTANEOUS | Status: DC | PRN

## 2022-03-10 MED ORDER — COCONUT OIL OIL: 1.0000 | TOPICAL_OIL | Status: DC | PRN

## 2022-03-10 MED ORDER — SIMETHICONE 80 MG PO CHEW: 80.0000 mg | CHEWABLE_TABLET | ORAL | Status: DC | PRN

## 2022-03-10 MED ORDER — SENNOSIDES-DOCUSATE SODIUM 8.6-50 MG PO TABS
2.0000 | ORAL_TABLET | Freq: Every day | ORAL | Status: DC
  Administered 2022-03-11: 2 via ORAL
  Filled 2022-03-10: qty 2

## 2022-03-10 MED ORDER — ZOLPIDEM TARTRATE 5 MG PO TABS: 5.0000 mg | ORAL_TABLET | Freq: Every evening | ORAL | Status: DC | PRN

## 2022-03-10 MED ORDER — PRENATAL MULTIVITAMIN CH
1.0000 | ORAL_TABLET | Freq: Every day | ORAL | Status: DC
  Administered 2022-03-10 – 2022-03-11 (×2): 1 via ORAL
  Filled 2022-03-10 (×2): qty 1

## 2022-03-10 MED ORDER — LEVOTHYROXINE SODIUM 88 MCG PO TABS
88.0000 ug | ORAL_TABLET | Freq: Every day | ORAL | Status: DC
  Filled 2022-03-10: qty 1

## 2022-03-10 MED ORDER — ONDANSETRON HCL 4 MG/2ML IJ SOLN: 4.0000 mg | INTRAMUSCULAR | Status: DC | PRN

## 2022-03-10 MED ORDER — ONDANSETRON HCL 4 MG PO TABS: 4.0000 mg | ORAL_TABLET | ORAL | Status: DC | PRN

## 2022-03-10 MED ORDER — BENZOCAINE-MENTHOL 20-0.5 % EX AERO
1.0000 | INHALATION_SPRAY | CUTANEOUS | Status: DC | PRN
  Filled 2022-03-10: qty 56

## 2022-03-10 MED ORDER — TETANUS-DIPHTH-ACELL PERTUSSIS 5-2.5-18.5 LF-MCG/0.5 IM SUSY: 0.5000 mL | PREFILLED_SYRINGE | Freq: Once | INTRAMUSCULAR | Status: DC

## 2022-03-10 MED ORDER — ACETAMINOPHEN 325 MG PO TABS: 650.0000 mg | ORAL_TABLET | ORAL | Status: DC | PRN

## 2022-03-10 MED ORDER — DIPHENHYDRAMINE HCL 25 MG PO CAPS: 25.0000 mg | ORAL_CAPSULE | Freq: Four times a day (QID) | ORAL | Status: DC | PRN

## 2022-03-10 NOTE — Progress Notes (Signed)
S: Working through contractions on birthing ball with husband as support. Discussed the R/B/A of AROM for labor induction and patient consents to procedure.   O: Vitals:   03/10/22 0200 03/10/22 0235 03/10/22 0301 03/10/22 0331  BP: 126/67 133/85 (!) 140/69 125/64  Pulse: 79 96 88 79  Resp:      Temp:      TempSrc:      SpO2:      Weight:      Height:       FHT:  FHR: 125 bpm, variability: moderate,  accelerations:  Present,  decelerations:  Absent UC:   regular, every 1-2 minutes SVE:   Dilation: 3.5 Effacement (%): 90 Station: -1 Exam by:: Debra Robinson, CNM  AROM of a large amount of clear fluid at 0343.   A / P: Induction of labor due to LGA, progressing well on Pitocin, now AROMed for clear fluid  Fetal Wellbeing:  Category I GBS: Positive, treated with Ancef Pain Control:  Labor support without medications Anticipated MOD:  NSVD  Desires hydrotherapy. Will turn off Pitocin to allow for shower and/or tub immersion.  Suzan Nailer, CNM, MSN 03/10/2022, 3:59 AM

## 2022-03-10 NOTE — Lactation Note (Addendum)
This note was copied from a baby's chart. Lactation Consultation Note  Patient Name: Boy Cabrini Ruggieri [id number] Date: 03/10/2022 Age: 34 hours old  Reason for consult: Initial assessment;Term;Maternal endocrine disorder (P3 , Experienced BF . per mom baby has BF x 3 since birth with swallows and comfort) LC discussed the need for Latch assessment and to call for [place].   Maternal Data Has patient been taught Hand Expression?: No (per mom feels comfortable hand expressing) Does the patient have breastfeeding experience prior to this delivery?: Yes How long did the patient breastfeed?: per mom 1st and 2nd baby 13 months  Feeding Mother's Current Feeding Choice: Breast Milk  LATCH Score - mom aware a latch score is needed    Lactation Tools Discussed/Used    Interventions Interventions: Breast feeding basics reviewed;Education;LC Services brochure  Discharge Pump: Personal;DEBP;Hands Free WIC Program: No  Consult Status Consult Status: Follow-up Date: 03/10/22 Follow-up type: In-patient    [place] 03/10/2022, 10:29 AM

## 2022-03-11 DIAGNOSIS — O9902 Anemia complicating childbirth: Secondary | ICD-10-CM | POA: Diagnosis not present

## 2022-03-11 LAB — CBC
HCT: 29.3 % — ABNORMAL LOW (ref 36.0–46.0)
Hemoglobin: 10.3 g/dL — ABNORMAL LOW (ref 12.0–15.0)
MCH: 30 pg (ref 26.0–34.0)
MCHC: 35.2 g/dL (ref 30.0–36.0)
MCV: 85.4 fL (ref 80.0–100.0)
Platelets: 207 10*3/uL (ref 150–400)
RBC: 3.43 MIL/uL — ABNORMAL LOW (ref 3.87–5.11)
RDW: 13.1 % (ref 11.5–15.5)
WBC: 11.2 10*3/uL — ABNORMAL HIGH (ref 4.0–10.5)
nRBC: 0 % (ref 0.0–0.2)

## 2022-03-11 MED ORDER — POLYSACCHARIDE IRON COMPLEX 150 MG PO CAPS
150.0000 mg | ORAL_CAPSULE | Freq: Every day | ORAL | Status: DC
  Administered 2022-03-11: 150 mg via ORAL
  Filled 2022-03-11: qty 1

## 2022-03-11 MED ORDER — MAGNESIUM OXIDE -MG SUPPLEMENT 400 (240 MG) MG PO TABS
400.0000 mg | ORAL_TABLET | Freq: Every day | ORAL | Status: DC
  Administered 2022-03-11: 400 mg via ORAL
  Filled 2022-03-11: qty 1

## 2022-03-11 MED ORDER — POLYSACCHARIDE IRON COMPLEX 150 MG PO CAPS: 150.0000 mg | ORAL_CAPSULE | Freq: Every day | ORAL | 1 refills | Status: AC

## 2022-03-11 MED ORDER — MAGNESIUM OXIDE -MG SUPPLEMENT 400 (240 MG) MG PO TABS: 400.0000 mg | ORAL_TABLET | Freq: Every day | ORAL | 1 refills | Status: AC

## 2022-03-11 NOTE — Discharge Summary (Signed)
OB Discharge Summary  Patient Name: Debra Robinson DOB: [birth year] MRN: [id number]  Date of admission: 03/09/2022 Delivering provider: Gavin Potters K   Admitting diagnosis: Pregnant [Z34.90] Intrauterine pregnancy: [redacted]w[redacted]d     Secondary diagnosis: Patient Active Problem List   Diagnosis Date Noted   Maternal anemia, with delivery 03/11/2022   Positive GBS test 03/10/2022   SVD (spontaneous vaginal delivery) 03/10/2022   First degree perineal laceration 03/10/2022   Postpartum care following vaginal delivery 1/6 03/10/2022   Encounter for induction of labor 08/27/2019   Hypothyroidism 12/06/2017    Date of discharge: 03/11/2022   Discharge diagnosis: Principal Problem:   Postpartum care following vaginal delivery 1/6 Active Problems:   Hypothyroidism   Encounter for induction of labor   Positive GBS test   SVD (spontaneous vaginal delivery)   First degree perineal laceration   Maternal anemia, with delivery                                                            Augmentation: AROM and Pitocin Pain control: None  [id number]:6WF degree  Complications: None  Hospital course:  Induction of Labor With Vaginal Delivery   32 y.o. yo [id number] at [redacted]w[redacted]d was admitted to the hospital 03/09/2022 for induction of labor.  Indication for induction:  LGA .  Membrane Rupture Time/Date: 3:43 AM ,03/10/2022   Delivery Method:Vaginal, Spontaneous  Episiotomy: None  Lacerations:  1st degree  Details of delivery can be found in separate delivery note.  Patient had a postpartum course complicated by mild anemia. She is asymptomatic and will take PO iron for 6 weeks postpartum. Patient is discharged home 03/11/22.  Newborn Data: Birth date:03/10/2022  Birth time:6:21 AM  Gender:Female  Living status:Living  Apgars:9 ,9  Weight:4224 g   Physical exam  Vitals:   03/10/22 1040 03/10/22 1450 03/10/22 1953 03/11/22 0513  BP: 119/71 119/80 122/76 127/88  Pulse: 81 77 79 91  Resp: 18 17 16 17    Temp: 99 F (37.2 C) 98.3 F (36.8 C) 98.2 F (36.8 C) 97.8 F (36.6 C)  TempSrc: Oral Oral Oral Oral  SpO2:      Weight:      Height:       General: alert, cooperative, and no distress Lochia: appropriate Uterine Fundus: firm Perineum: well approximated DVT Evaluation: No evidence of DVT seen on physical exam.  Labs: Lab Results  Component Value Date   WBC 11.2 (H) 03/11/2022   HGB 10.3 (L) 03/11/2022   HCT 29.3 (L) 03/11/2022   MCV 85.4 03/11/2022   PLT 207 03/11/2022      08/28/2019    3:17 AM  Edinburgh Postnatal Depression Scale Screening Tool  I have been able to laugh and see the funny side of things. 0  I have looked forward with enjoyment to things. 0  I have blamed myself unnecessarily when things went wrong. 0  I have been anxious or worried for no good reason. 0  I have felt scared or panicky for no good reason. 1  Things have been getting on top of me. 0  I have been so unhappy that I have had difficulty sleeping. 0  I have felt sad or miserable. 0  I have been so unhappy that I have been crying. 1  The thought  of harming myself has occurred to me. 0  Edinburgh Postnatal Depression Scale Total 2   Discharge instructions:  per After Visit Summary  After Visit Meds:  Allergies as of 03/11/2022       Reactions   Shellfish Allergy Hives, Swelling   Lip swelling Allergy has developed during pregnancy.   Augmentin [amoxicillin-pot Clavulanate] Rash        Medication List     STOP taking these medications    hydrochlorothiazide 12.5 MG capsule Commonly known as: MICROZIDE   Pepcid 20 MG tablet Generic drug: famotidine       TAKE these medications    iron polysaccharides 150 MG capsule Commonly known as: Ferrex 150 Take 1 capsule (150 mg total) by mouth daily.   levothyroxine 200 MCG tablet Commonly known as: SYNTHROID Take 200 mcg by mouth daily before breakfast. What changed: Another medication with the same name was removed.  Continue taking this medication, and follow the directions you see here.   magnesium oxide 400 (240 Mg) MG tablet Commonly known as: MAG-OX Take 1 tablet (400 mg total) by mouth daily.   PRENATAL PO Take 2 each by mouth every evening. VitaFusion prenatal gummies.       Activity: Advance as tolerated. Pelvic rest for 6 weeks.   Newborn Data: Live born female  Birth Weight: 9 lb 5 oz (4224 g) APGAR: 9, 9  Newborn Delivery   Birth date/time: 03/10/2022 06:21:00 Delivery type: Vaginal, Spontaneous     Named [name] Baby Feeding: Breast Circumcision: Completed/Dr. Conni Elliot Disposition:home with mother  Delivery Report:   Review the Delivery Report for details.    Follow up:  Follow-up Information     June Leap, CNM. Schedule an appointment as soon as possible for a visit in 6 week(s).   Specialty: Certified Nurse Midwife Contact information: [address] [place] [address] [phone]                June Leap, CNM, MSN 03/11/2022, 9:11 AM

## 2022-03-11 NOTE — Lactation Note (Signed)
This note was copied from a baby's chart. Lactation Consultation Note  Patient Name: Debra Robinson [id number] Date: 03/11/2022 Age 34 hours  Reason for consult: Follow-up assessment;Term;Infant weight loss;Maternal endocrine disorder (6 % weight loss , post circ and per mom baby recently fed after circ) LC reviewed BF D/C teaching and mom aware of [place] resources.  Per mom feels like her nipples need to toughen up. LC reviewed the reverse pressure exercise with mom and recommended prior to latching to prevent soreness.  Per mom has to supplement with her 1st 2 babies. LC recommended the 1st 2 weeks are the most important weeks of BF , LC recommended after feeding when breast are fuller , release down to enhance milk supply and may not need to supplement with formula.   Maternal Data    Feeding Mother's Current Feeding Choice: Breast Milk  LATCH Score - baby post  circ , sleepy and recently fed     Interventions Education     Discharge Discharge Education: Engorgement and breast care;Warning signs for feeding baby Pump: Personal;DEBP;Hands Free  Consult Status Consult Status: Complete Date: 03/11/22    Myer Haff 03/11/2022, 11:45 AM

## 2022-03-19 ENCOUNTER — Telehealth (HOSPITAL_COMMUNITY): Payer: Self-pay | Admitting: *Deleted

## 2022-03-19 DIAGNOSIS — O139 Gestational [pregnancy-induced] hypertension without significant proteinuria, unspecified trimester: Secondary | ICD-10-CM | POA: Diagnosis not present

## 2022-03-19 DIAGNOSIS — O135 Gestational [pregnancy-induced] hypertension without significant proteinuria, complicating the puerperium: Secondary | ICD-10-CM | POA: Diagnosis not present

## 2022-03-19 NOTE — Telephone Encounter (Signed)
Left phone voicemail message.  Odis Hollingshead, RN 03-19-2022 at 11:14am

## 2022-03-22 DIAGNOSIS — O169 Unspecified maternal hypertension, unspecified trimester: Secondary | ICD-10-CM | POA: Diagnosis not present

## 2022-03-22 DIAGNOSIS — E039 Hypothyroidism, unspecified: Secondary | ICD-10-CM | POA: Diagnosis not present

## 2022-03-22 DIAGNOSIS — O139 Gestational [pregnancy-induced] hypertension without significant proteinuria, unspecified trimester: Secondary | ICD-10-CM | POA: Diagnosis not present

## 2022-04-05 DIAGNOSIS — O165 Unspecified maternal hypertension, complicating the puerperium: Secondary | ICD-10-CM | POA: Diagnosis not present

## 2022-04-24 DIAGNOSIS — J0100 Acute maxillary sinusitis, unspecified: Secondary | ICD-10-CM | POA: Diagnosis not present

## 2022-05-31 DIAGNOSIS — E039 Hypothyroidism, unspecified: Secondary | ICD-10-CM | POA: Diagnosis not present

## 2022-06-28 DIAGNOSIS — Z01419 Encounter for gynecological examination (general) (routine) without abnormal findings: Secondary | ICD-10-CM | POA: Diagnosis not present

## 2022-06-28 DIAGNOSIS — E039 Hypothyroidism, unspecified: Secondary | ICD-10-CM | POA: Diagnosis not present

## 2022-08-06 DIAGNOSIS — E039 Hypothyroidism, unspecified: Secondary | ICD-10-CM | POA: Diagnosis not present

## 2023-07-01 DIAGNOSIS — J0140 Acute pansinusitis, unspecified: Secondary | ICD-10-CM | POA: Diagnosis not present

## 2023-07-04 DIAGNOSIS — R112 Nausea with vomiting, unspecified: Secondary | ICD-10-CM | POA: Diagnosis not present

## 2023-07-22 DIAGNOSIS — Z0000 Encounter for general adult medical examination without abnormal findings: Secondary | ICD-10-CM | POA: Diagnosis not present

## 2023-07-22 DIAGNOSIS — Z131 Encounter for screening for diabetes mellitus: Secondary | ICD-10-CM | POA: Diagnosis not present

## 2023-07-22 DIAGNOSIS — Z1321 Encounter for screening for nutritional disorder: Secondary | ICD-10-CM | POA: Diagnosis not present

## 2023-07-22 DIAGNOSIS — Z1331 Encounter for screening for depression: Secondary | ICD-10-CM | POA: Diagnosis not present

## 2023-07-22 DIAGNOSIS — E7800 Pure hypercholesterolemia, unspecified: Secondary | ICD-10-CM | POA: Diagnosis not present

## 2023-07-22 DIAGNOSIS — R5383 Other fatigue: Secondary | ICD-10-CM | POA: Diagnosis not present

## 2023-07-22 DIAGNOSIS — N898 Other specified noninflammatory disorders of vagina: Secondary | ICD-10-CM | POA: Diagnosis not present

## 2023-07-22 DIAGNOSIS — Z01419 Encounter for gynecological examination (general) (routine) without abnormal findings: Secondary | ICD-10-CM | POA: Diagnosis not present

## 2023-07-22 DIAGNOSIS — R946 Abnormal results of thyroid function studies: Secondary | ICD-10-CM | POA: Diagnosis not present

## 2023-07-22 DIAGNOSIS — Z124 Encounter for screening for malignant neoplasm of cervix: Secondary | ICD-10-CM | POA: Diagnosis not present

## 2023-07-22 DIAGNOSIS — E890 Postprocedural hypothyroidism: Secondary | ICD-10-CM | POA: Diagnosis not present

## 2023-08-05 DIAGNOSIS — Z1231 Encounter for screening mammogram for malignant neoplasm of breast: Secondary | ICD-10-CM | POA: Diagnosis not present

## 2024-01-03 DIAGNOSIS — E039 Hypothyroidism, unspecified: Secondary | ICD-10-CM | POA: Diagnosis not present

## 2024-01-28 DIAGNOSIS — J020 Streptococcal pharyngitis: Secondary | ICD-10-CM | POA: Diagnosis not present
